# Patient Record
Sex: Female | Born: 1945 | Race: Black or African American | Hispanic: No
Health system: Southern US, Community
[De-identification: ages and names within clinical notes are randomized; demographics above are authoritative.]

---

## 2004-02-24 ENCOUNTER — Encounter: Payer: Self-pay | Admitting: Rheumatology

## 2004-07-15 ENCOUNTER — Emergency Department: Payer: Self-pay | Admitting: Emergency Medicine

## 2004-08-01 ENCOUNTER — Ambulatory Visit: Payer: Self-pay | Admitting: Internal Medicine

## 2004-09-11 ENCOUNTER — Ambulatory Visit: Payer: Self-pay

## 2004-09-20 ENCOUNTER — Ambulatory Visit: Payer: Self-pay

## 2004-11-22 ENCOUNTER — Ambulatory Visit: Payer: Self-pay | Admitting: Orthopedic Surgery

## 2004-11-28 ENCOUNTER — Ambulatory Visit: Payer: Self-pay | Admitting: Orthopedic Surgery

## 2005-03-25 ENCOUNTER — Ambulatory Visit: Payer: Self-pay | Admitting: Internal Medicine

## 2005-07-11 ENCOUNTER — Ambulatory Visit: Payer: Self-pay | Admitting: Internal Medicine

## 2006-03-21 ENCOUNTER — Ambulatory Visit: Payer: Self-pay | Admitting: Rheumatology

## 2006-04-09 ENCOUNTER — Ambulatory Visit: Payer: Self-pay | Admitting: Rheumatology

## 2006-07-10 ENCOUNTER — Ambulatory Visit: Payer: Self-pay | Admitting: Internal Medicine

## 2006-08-27 ENCOUNTER — Ambulatory Visit: Payer: Self-pay | Admitting: Internal Medicine

## 2006-09-10 ENCOUNTER — Ambulatory Visit: Payer: Self-pay | Admitting: Internal Medicine

## 2006-09-16 ENCOUNTER — Ambulatory Visit: Payer: Self-pay | Admitting: Gastroenterology

## 2006-10-01 ENCOUNTER — Ambulatory Visit: Payer: Self-pay | Admitting: Gastroenterology

## 2006-10-21 ENCOUNTER — Ambulatory Visit: Payer: Self-pay | Admitting: Internal Medicine

## 2006-11-03 ENCOUNTER — Ambulatory Visit: Payer: Self-pay | Admitting: Internal Medicine

## 2006-11-06 ENCOUNTER — Ambulatory Visit: Payer: Self-pay | Admitting: Gastroenterology

## 2006-11-07 ENCOUNTER — Ambulatory Visit: Payer: Self-pay | Admitting: Surgery

## 2006-11-07 ENCOUNTER — Other Ambulatory Visit: Payer: Self-pay

## 2006-11-12 ENCOUNTER — Ambulatory Visit: Payer: Self-pay | Admitting: Surgery

## 2006-11-25 ENCOUNTER — Ambulatory Visit: Payer: Self-pay | Admitting: Internal Medicine

## 2006-12-25 ENCOUNTER — Ambulatory Visit: Payer: Self-pay | Admitting: Internal Medicine

## 2007-02-24 ENCOUNTER — Ambulatory Visit: Payer: Self-pay | Admitting: Internal Medicine

## 2007-03-05 ENCOUNTER — Ambulatory Visit: Payer: Self-pay | Admitting: Internal Medicine

## 2007-03-27 ENCOUNTER — Ambulatory Visit: Payer: Self-pay | Admitting: Internal Medicine

## 2007-04-07 ENCOUNTER — Ambulatory Visit: Payer: Self-pay | Admitting: Surgery

## 2007-04-08 ENCOUNTER — Ambulatory Visit: Payer: Self-pay | Admitting: Surgery

## 2007-04-16 ENCOUNTER — Ambulatory Visit: Payer: Self-pay | Admitting: Internal Medicine

## 2007-04-27 ENCOUNTER — Ambulatory Visit: Payer: Self-pay | Admitting: Internal Medicine

## 2007-05-27 ENCOUNTER — Ambulatory Visit: Payer: Self-pay | Admitting: Internal Medicine

## 2007-06-27 ENCOUNTER — Ambulatory Visit: Payer: Self-pay | Admitting: Internal Medicine

## 2007-07-28 ENCOUNTER — Ambulatory Visit: Payer: Self-pay | Admitting: Internal Medicine

## 2007-08-25 ENCOUNTER — Ambulatory Visit: Payer: Self-pay | Admitting: Internal Medicine

## 2007-09-09 ENCOUNTER — Ambulatory Visit: Payer: Self-pay | Admitting: Internal Medicine

## 2007-09-14 ENCOUNTER — Ambulatory Visit: Payer: Self-pay | Admitting: Surgery

## 2007-09-17 ENCOUNTER — Ambulatory Visit: Payer: Self-pay | Admitting: Internal Medicine

## 2007-09-27 ENCOUNTER — Ambulatory Visit: Payer: Self-pay | Admitting: Internal Medicine

## 2007-10-25 ENCOUNTER — Ambulatory Visit: Payer: Self-pay | Admitting: Internal Medicine

## 2007-11-10 ENCOUNTER — Ambulatory Visit: Payer: Self-pay | Admitting: Surgery

## 2007-11-23 ENCOUNTER — Ambulatory Visit: Payer: Self-pay | Admitting: Internal Medicine

## 2007-11-25 ENCOUNTER — Ambulatory Visit: Payer: Self-pay | Admitting: Internal Medicine

## 2007-12-25 ENCOUNTER — Ambulatory Visit: Payer: Self-pay | Admitting: Internal Medicine

## 2008-01-25 ENCOUNTER — Ambulatory Visit: Payer: Self-pay | Admitting: Internal Medicine

## 2008-02-07 ENCOUNTER — Other Ambulatory Visit: Payer: Self-pay

## 2008-02-07 ENCOUNTER — Emergency Department: Payer: Self-pay | Admitting: Emergency Medicine

## 2008-02-15 ENCOUNTER — Other Ambulatory Visit: Payer: Self-pay

## 2008-02-15 ENCOUNTER — Inpatient Hospital Stay: Payer: Self-pay | Admitting: Internal Medicine

## 2008-02-24 ENCOUNTER — Ambulatory Visit: Payer: Self-pay | Admitting: Internal Medicine

## 2008-03-15 ENCOUNTER — Ambulatory Visit: Payer: Self-pay | Admitting: Internal Medicine

## 2008-03-26 ENCOUNTER — Ambulatory Visit: Payer: Self-pay | Admitting: Internal Medicine

## 2008-04-15 ENCOUNTER — Ambulatory Visit: Payer: Self-pay | Admitting: Internal Medicine

## 2008-04-17 ENCOUNTER — Emergency Department: Payer: Self-pay | Admitting: Internal Medicine

## 2008-04-17 ENCOUNTER — Other Ambulatory Visit: Payer: Self-pay

## 2008-04-26 ENCOUNTER — Ambulatory Visit: Payer: Self-pay | Admitting: Internal Medicine

## 2008-05-26 ENCOUNTER — Ambulatory Visit: Payer: Self-pay | Admitting: Internal Medicine

## 2008-07-05 ENCOUNTER — Emergency Department: Payer: Self-pay | Admitting: Unknown Physician Specialty

## 2008-07-19 ENCOUNTER — Ambulatory Visit: Payer: Self-pay | Admitting: Internal Medicine

## 2008-07-26 ENCOUNTER — Ambulatory Visit: Payer: Self-pay | Admitting: Internal Medicine

## 2008-08-16 ENCOUNTER — Ambulatory Visit: Payer: Self-pay | Admitting: Neurology

## 2008-08-26 ENCOUNTER — Ambulatory Visit: Payer: Self-pay | Admitting: Internal Medicine

## 2008-09-01 ENCOUNTER — Ambulatory Visit: Payer: Self-pay | Admitting: Internal Medicine

## 2008-09-12 ENCOUNTER — Emergency Department: Payer: Self-pay | Admitting: Emergency Medicine

## 2008-09-16 ENCOUNTER — Ambulatory Visit: Payer: Self-pay | Admitting: Surgery

## 2008-09-22 ENCOUNTER — Ambulatory Visit: Payer: Self-pay | Admitting: Rheumatology

## 2008-09-26 ENCOUNTER — Ambulatory Visit: Payer: Self-pay | Admitting: Internal Medicine

## 2008-10-24 ENCOUNTER — Ambulatory Visit: Payer: Self-pay | Admitting: Internal Medicine

## 2008-11-24 ENCOUNTER — Ambulatory Visit: Payer: Self-pay | Admitting: Internal Medicine

## 2008-11-28 ENCOUNTER — Ambulatory Visit: Payer: Self-pay | Admitting: Unknown Physician Specialty

## 2009-03-19 ENCOUNTER — Emergency Department: Payer: Self-pay | Admitting: Emergency Medicine

## 2009-04-21 ENCOUNTER — Ambulatory Visit: Payer: Self-pay | Admitting: Gastroenterology

## 2009-04-28 ENCOUNTER — Emergency Department: Payer: Self-pay | Admitting: Emergency Medicine

## 2009-05-17 ENCOUNTER — Inpatient Hospital Stay: Payer: Self-pay | Admitting: Internal Medicine

## 2009-08-15 ENCOUNTER — Ambulatory Visit: Payer: Self-pay

## 2009-09-14 ENCOUNTER — Emergency Department: Payer: Self-pay | Admitting: Emergency Medicine

## 2009-09-26 ENCOUNTER — Emergency Department: Payer: Self-pay | Admitting: Emergency Medicine

## 2009-10-02 ENCOUNTER — Ambulatory Visit: Payer: Self-pay | Admitting: Internal Medicine

## 2009-11-08 ENCOUNTER — Emergency Department: Payer: Self-pay | Admitting: Emergency Medicine

## 2009-11-27 ENCOUNTER — Ambulatory Visit: Payer: Self-pay | Admitting: Gastroenterology

## 2009-12-11 ENCOUNTER — Emergency Department: Payer: Self-pay

## 2010-01-10 ENCOUNTER — Ambulatory Visit: Payer: Self-pay | Admitting: Neurology

## 2010-01-31 ENCOUNTER — Emergency Department: Payer: Self-pay | Admitting: Emergency Medicine

## 2010-06-01 ENCOUNTER — Emergency Department: Payer: Self-pay | Admitting: Unknown Physician Specialty

## 2010-08-10 ENCOUNTER — Emergency Department (HOSPITAL_COMMUNITY)
Admission: EM | Admit: 2010-08-10 | Discharge: 2010-08-10 | Payer: Self-pay | Source: Home / Self Care | Admitting: Emergency Medicine

## 2010-10-04 ENCOUNTER — Ambulatory Visit: Payer: Self-pay | Admitting: Internal Medicine

## 2010-10-20 ENCOUNTER — Emergency Department: Payer: Self-pay | Admitting: Internal Medicine

## 2010-11-05 LAB — URINALYSIS, ROUTINE W REFLEX MICROSCOPIC
Bilirubin Urine: NEGATIVE
Glucose, UA: NEGATIVE mg/dL
Hgb urine dipstick: NEGATIVE
Specific Gravity, Urine: 1.012 (ref 1.005–1.030)
pH: 7.5 (ref 5.0–8.0)

## 2010-11-05 LAB — CBC
Hemoglobin: 11.2 g/dL — ABNORMAL LOW (ref 12.0–15.0)
MCV: 84.8 fL (ref 78.0–100.0)
WBC: 11.4 10*3/uL — ABNORMAL HIGH (ref 4.0–10.5)

## 2010-11-05 LAB — BASIC METABOLIC PANEL
Calcium: 8.7 mg/dL (ref 8.4–10.5)
GFR calc Af Amer: >60 mL/min (ref >60)
GFR calc non Af Amer: 50 mL/min — ABNORMAL LOW (ref >60)
Sodium: 142 mEq/L (ref 135–145)

## 2010-11-05 LAB — DIFFERENTIAL
Basophils Relative: 0 % (ref 0–1)
Eosinophils Absolute: 0.1 10*3/uL (ref 0.0–0.7)
Eosinophils Relative: 1 % (ref 0–5)
Lymphocytes Relative: 12 % (ref 12–46)
Monocytes Relative: 13 % — ABNORMAL HIGH (ref 3–12)
Neutro Abs: 8.5 10*3/uL — ABNORMAL HIGH (ref 1.7–7.7)

## 2010-11-05 LAB — URINE CULTURE
Colony Count: 6000
Culture  Setup Time: 201112161145

## 2010-11-19 ENCOUNTER — Emergency Department: Payer: Self-pay | Admitting: Emergency Medicine

## 2011-04-12 ENCOUNTER — Ambulatory Visit: Payer: Self-pay | Admitting: Internal Medicine

## 2011-05-06 ENCOUNTER — Ambulatory Visit: Payer: Self-pay | Admitting: Internal Medicine

## 2011-05-23 ENCOUNTER — Other Ambulatory Visit: Payer: Self-pay | Admitting: Gastroenterology

## 2011-05-24 ENCOUNTER — Other Ambulatory Visit: Payer: Self-pay | Admitting: Gastroenterology

## 2011-05-29 ENCOUNTER — Emergency Department: Payer: Self-pay

## 2011-08-08 ENCOUNTER — Ambulatory Visit: Payer: Self-pay | Admitting: Rheumatology

## 2011-09-17 ENCOUNTER — Ambulatory Visit: Payer: Self-pay | Admitting: Specialist

## 2011-10-10 ENCOUNTER — Ambulatory Visit: Payer: Self-pay | Admitting: Specialist

## 2012-01-23 ENCOUNTER — Inpatient Hospital Stay: Payer: Self-pay | Admitting: Internal Medicine

## 2012-01-23 LAB — COMPREHENSIVE METABOLIC PANEL
Anion Gap: 11 (ref 7–16)
BUN: 19 mg/dL — ABNORMAL HIGH (ref 7–18)
Bilirubin,Total: 0.3 mg/dL (ref 0.2–1.0)
Chloride: 106 mmol/L (ref 98–107)
Co2: 27 mmol/L (ref 21–32)
Glucose: 80 mg/dL (ref 65–99)
Osmolality: 288 (ref 275–301)
Potassium: 3.2 mmol/L — ABNORMAL LOW (ref 3.5–5.1)
SGPT (ALT): 12 U/L
Sodium: 144 mmol/L (ref 136–145)

## 2012-01-23 LAB — CBC
HCT: 36.4 % (ref 35.0–47.0)
HGB: 11.7 g/dL — ABNORMAL LOW (ref 12.0–16.0)
MCH: 26.7 pg (ref 26.0–34.0)
MCHC: 32.2 g/dL (ref 32.0–36.0)
MCV: 83 fL (ref 80–100)
Platelet: 269 10*3/uL (ref 150–440)
WBC: 6.8 10*3/uL (ref 3.6–11.0)

## 2012-01-23 LAB — URINALYSIS, COMPLETE
Blood: NEGATIVE
Glucose,UR: NEGATIVE mg/dL (ref 0–75)
Ph: 6 (ref 4.5–8.0)
Protein: 30
Squamous Epithelial: <1

## 2012-01-23 LAB — LIPASE, BLOOD: Lipase: 45 U/L — ABNORMAL LOW (ref 73–393)

## 2012-01-23 LAB — TROPONIN I: Troponin-I: <0.02 ng/mL

## 2012-01-24 LAB — BASIC METABOLIC PANEL
Anion Gap: 10 (ref 7–16)
BUN: 16 mg/dL (ref 7–18)
Calcium, Total: 8.2 mg/dL — ABNORMAL LOW (ref 8.5–10.1)
Co2: 26 mmol/L (ref 21–32)
Glucose: 80 mg/dL (ref 65–99)
Sodium: 145 mmol/L (ref 136–145)

## 2012-01-24 LAB — CBC WITH DIFFERENTIAL/PLATELET
Basophil #: 0 10*3/uL (ref 0.0–0.1)
Eosinophil #: 0 10*3/uL (ref 0.0–0.7)
MCV: 82 fL (ref 80–100)
Monocyte #: 1.9 x10 3/mm — ABNORMAL HIGH (ref 0.2–0.9)
Platelet: 256 10*3/uL (ref 150–440)
RDW: 16.3 % — ABNORMAL HIGH (ref 11.5–14.5)

## 2012-01-24 LAB — LIPID PANEL
HDL Cholesterol: 91 mg/dL — ABNORMAL HIGH (ref 40–60)
Triglycerides: 41 mg/dL (ref 0–200)
VLDL Cholesterol, Calc: 8 mg/dL (ref 5–40)

## 2012-01-25 LAB — MAGNESIUM: Magnesium: 2 mg/dL

## 2012-01-25 LAB — CBC WITH DIFFERENTIAL/PLATELET
Basophil %: 0.2 %
Eosinophil #: 0 10*3/uL (ref 0.0–0.7)
HCT: 32.3 % — ABNORMAL LOW (ref 35.0–47.0)
HGB: 10.3 g/dL — ABNORMAL LOW (ref 12.0–16.0)
Lymphocyte #: 1.8 10*3/uL (ref 1.0–3.6)
Lymphocyte %: 7.2 %
MCH: 26.5 pg (ref 26.0–34.0)
MCHC: 31.9 g/dL — ABNORMAL LOW (ref 32.0–36.0)
Neutrophil #: 21.3 10*3/uL — ABNORMAL HIGH (ref 1.4–6.5)
Neutrophil %: 85.1 %
RBC: 3.89 10*6/uL (ref 3.80–5.20)

## 2012-01-25 LAB — BASIC METABOLIC PANEL
BUN: 14 mg/dL (ref 7–18)
Chloride: 107 mmol/L (ref 98–107)
Glucose: 78 mg/dL (ref 65–99)
Osmolality: 286 (ref 275–301)
Potassium: 3.5 mmol/L (ref 3.5–5.1)

## 2012-01-26 LAB — CBC WITH DIFFERENTIAL/PLATELET
Basophil #: 0 10*3/uL (ref 0.0–0.1)
Eosinophil #: 0 10*3/uL (ref 0.0–0.7)
Eosinophil %: 0 %
Lymphocyte #: 1 10*3/uL (ref 1.0–3.6)
MCHC: 31.7 g/dL — ABNORMAL LOW (ref 32.0–36.0)
MCV: 83 fL (ref 80–100)
Monocyte #: 1.1 x10 3/mm — ABNORMAL HIGH (ref 0.2–0.9)
Monocyte %: 4.4 %
Neutrophil %: 91.6 %
Platelet: 224 10*3/uL (ref 150–440)
RBC: 3.89 10*6/uL (ref 3.80–5.20)
RDW: 16.7 % — ABNORMAL HIGH (ref 11.5–14.5)
WBC: 25.3 10*3/uL — ABNORMAL HIGH (ref 3.6–11.0)

## 2012-01-26 LAB — BASIC METABOLIC PANEL
Anion Gap: 9 (ref 7–16)
BUN: 26 mg/dL — ABNORMAL HIGH (ref 7–18)
Chloride: 103 mmol/L (ref 98–107)
Co2: 25 mmol/L (ref 21–32)
Creatinine: 1.6 mg/dL — ABNORMAL HIGH (ref 0.60–1.30)
EGFR (African American): 38 — ABNORMAL LOW
Osmolality: 282 (ref 275–301)

## 2012-01-27 LAB — CBC WITH DIFFERENTIAL/PLATELET
Basophil #: 0.1 10*3/uL (ref 0.0–0.1)
Eosinophil #: 0 10*3/uL (ref 0.0–0.7)
Eosinophil %: 0 %
MCH: 25.5 pg — ABNORMAL LOW (ref 26.0–34.0)
MCHC: 31.1 g/dL — ABNORMAL LOW (ref 32.0–36.0)
MCV: 82 fL (ref 80–100)
Neutrophil %: 81.9 %

## 2012-01-27 LAB — BASIC METABOLIC PANEL
Calcium, Total: 8.6 mg/dL (ref 8.5–10.1)
Chloride: 107 mmol/L (ref 98–107)
Creatinine: 1.41 mg/dL — ABNORMAL HIGH (ref 0.60–1.30)
EGFR (African American): 44 — ABNORMAL LOW
EGFR (Non-African Amer.): 38 — ABNORMAL LOW
Sodium: 142 mmol/L (ref 136–145)

## 2012-01-28 LAB — CBC WITH DIFFERENTIAL/PLATELET
Basophil %: 0.4 %
Eosinophil #: 0 10*3/uL (ref 0.0–0.7)
HCT: 31.1 % — ABNORMAL LOW (ref 35.0–47.0)
HGB: 9.7 g/dL — ABNORMAL LOW (ref 12.0–16.0)
Lymphocyte %: 17.1 %
MCHC: 31.2 g/dL — ABNORMAL LOW (ref 32.0–36.0)
MCV: 82 fL (ref 80–100)
Monocyte #: 2.1 x10 3/mm — ABNORMAL HIGH (ref 0.2–0.9)
Neutrophil #: 9.9 10*3/uL — ABNORMAL HIGH (ref 1.4–6.5)
Neutrophil %: 67.7 %
RBC: 3.78 10*6/uL — ABNORMAL LOW (ref 3.80–5.20)

## 2012-01-29 LAB — CBC WITH DIFFERENTIAL/PLATELET
Basophil %: 0.2 %
Eosinophil #: 0.1 10*3/uL (ref 0.0–0.7)
HCT: 30.4 % — ABNORMAL LOW (ref 35.0–47.0)
HGB: 9.7 g/dL — ABNORMAL LOW (ref 12.0–16.0)
Lymphocyte #: 2.1 10*3/uL (ref 1.0–3.6)
Lymphocyte %: 11.6 %
MCHC: 31.9 g/dL — ABNORMAL LOW (ref 32.0–36.0)
MCV: 81 fL (ref 80–100)
Monocyte %: 7.6 %
Platelet: 262 10*3/uL (ref 150–440)
RDW: 17.1 % — ABNORMAL HIGH (ref 11.5–14.5)
WBC: 17.7 10*3/uL — ABNORMAL HIGH (ref 3.6–11.0)

## 2012-01-29 LAB — CULTURE, BLOOD (SINGLE)

## 2012-01-29 LAB — CREATININE, SERUM
Creatinine: 1.16 mg/dL (ref 0.60–1.30)
EGFR (African American): 56 — ABNORMAL LOW

## 2012-01-30 LAB — BASIC METABOLIC PANEL
Chloride: 106 mmol/L (ref 98–107)
Co2: 27 mmol/L (ref 21–32)
Creatinine: 1.09 mg/dL (ref 0.60–1.30)
EGFR (Non-African Amer.): 52 — ABNORMAL LOW
Sodium: 142 mmol/L (ref 136–145)

## 2012-01-30 LAB — CBC WITH DIFFERENTIAL/PLATELET
Eosinophil #: 0 10*3/uL (ref 0.0–0.7)
Eosinophil %: 0 %
HCT: 31.6 % — ABNORMAL LOW (ref 35.0–47.0)
HGB: 9.9 g/dL — ABNORMAL LOW (ref 12.0–16.0)
Lymphocyte %: 4 %
MCHC: 31.2 g/dL — ABNORMAL LOW (ref 32.0–36.0)
Monocyte %: 2.8 %
Neutrophil #: 17 10*3/uL — ABNORMAL HIGH (ref 1.4–6.5)
Neutrophil %: 93.1 %
RBC: 3.88 10*6/uL (ref 3.80–5.20)

## 2012-01-31 LAB — CBC WITH DIFFERENTIAL/PLATELET
Basophil #: 0.1 10*3/uL (ref 0.0–0.1)
Basophil %: 0.3 %
Eosinophil #: 0 10*3/uL (ref 0.0–0.7)
Eosinophil %: 0.1 %
Lymphocyte #: 0.6 10*3/uL — ABNORMAL LOW (ref 1.0–3.6)
MCH: 25.4 pg — ABNORMAL LOW (ref 26.0–34.0)
MCHC: 31.1 g/dL — ABNORMAL LOW (ref 32.0–36.0)
MCV: 82 fL (ref 80–100)
Monocyte #: 1.1 x10 3/mm — ABNORMAL HIGH (ref 0.2–0.9)
Monocyte %: 4.7 %
Neutrophil #: 21 10*3/uL — ABNORMAL HIGH (ref 1.4–6.5)
RDW: 16.8 % — ABNORMAL HIGH (ref 11.5–14.5)

## 2012-02-01 LAB — BASIC METABOLIC PANEL
BUN: 41 mg/dL — ABNORMAL HIGH (ref 7–18)
Calcium, Total: 8.7 mg/dL (ref 8.5–10.1)
Chloride: 103 mmol/L (ref 98–107)
Creatinine: 1.34 mg/dL — ABNORMAL HIGH (ref 0.60–1.30)
Potassium: 4.7 mmol/L (ref 3.5–5.1)
Sodium: 140 mmol/L (ref 136–145)

## 2012-02-01 LAB — CBC WITH DIFFERENTIAL/PLATELET
Eosinophil %: 0 %
HCT: 30.2 % — ABNORMAL LOW (ref 35.0–47.0)
Lymphocyte %: 4.8 %
MCH: 26 pg (ref 26.0–34.0)
MCV: 81 fL (ref 80–100)
Monocyte %: 6 %
Neutrophil #: 16.8 10*3/uL — ABNORMAL HIGH (ref 1.4–6.5)
WBC: 18.9 10*3/uL — ABNORMAL HIGH (ref 3.6–11.0)

## 2012-02-01 LAB — URINALYSIS, COMPLETE
Bilirubin,UR: NEGATIVE
Blood: NEGATIVE
Hyaline Cast: 3
Protein: NEGATIVE

## 2012-02-02 LAB — CBC WITH DIFFERENTIAL/PLATELET
Basophil #: 0 10*3/uL (ref 0.0–0.1)
Basophil %: 0.1 %
Eosinophil #: 0 10*3/uL (ref 0.0–0.7)
Lymphocyte #: 2.1 10*3/uL (ref 1.0–3.6)
Lymphocyte %: 13.5 %
MCHC: 31.8 g/dL — ABNORMAL LOW (ref 32.0–36.0)

## 2012-02-02 LAB — BASIC METABOLIC PANEL
BUN: 44 mg/dL — ABNORMAL HIGH (ref 7–18)
Co2: 29 mmol/L (ref 21–32)
EGFR (Non-African Amer.): 43 — ABNORMAL LOW
Glucose: 91 mg/dL (ref 65–99)
Potassium: 4.2 mmol/L (ref 3.5–5.1)
Sodium: 143 mmol/L (ref 136–145)

## 2012-02-03 LAB — CBC WITH DIFFERENTIAL/PLATELET
Basophil #: 0 10*3/uL (ref 0.0–0.1)
Basophil %: 0.2 %
Eosinophil %: 0.9 %
Lymphocyte #: 2.1 10*3/uL (ref 1.0–3.6)
Lymphocyte %: 18 %
MCH: 25.5 pg — ABNORMAL LOW (ref 26.0–34.0)
MCV: 82 fL (ref 80–100)
Monocyte #: 1.3 x10 3/mm — ABNORMAL HIGH (ref 0.2–0.9)
Neutrophil %: 69.6 %
RDW: 16.5 % — ABNORMAL HIGH (ref 11.5–14.5)
WBC: 11.5 10*3/uL — ABNORMAL HIGH (ref 3.6–11.0)

## 2012-02-03 LAB — URINE CULTURE

## 2012-02-04 LAB — CBC WITH DIFFERENTIAL/PLATELET
Basophil #: 0 10*3/uL (ref 0.0–0.1)
Eosinophil #: 0 10*3/uL (ref 0.0–0.7)
HGB: 9.2 g/dL — ABNORMAL LOW (ref 12.0–16.0)
Lymphocyte %: 4.5 %
MCH: 26.1 pg (ref 26.0–34.0)
MCHC: 31.7 g/dL — ABNORMAL LOW (ref 32.0–36.0)
MCV: 82 fL (ref 80–100)
Monocyte #: 0.3 x10 3/mm (ref 0.2–0.9)
Monocyte %: 1.7 %
Neutrophil #: 14.1 10*3/uL — ABNORMAL HIGH (ref 1.4–6.5)
Neutrophil %: 93.8 %
RBC: 3.54 10*6/uL — ABNORMAL LOW (ref 3.80–5.20)
RDW: 16.2 % — ABNORMAL HIGH (ref 11.5–14.5)
WBC: 15 10*3/uL — ABNORMAL HIGH (ref 3.6–11.0)

## 2012-02-04 LAB — URINALYSIS, COMPLETE
Bacteria: NONE SEEN
Bilirubin,UR: NEGATIVE
Ketone: NEGATIVE
Leukocyte Esterase: NEGATIVE
Ph: 5 (ref 4.5–8.0)
Protein: NEGATIVE
Specific Gravity: 1.018 (ref 1.003–1.030)
Squamous Epithelial: 2
Transitional Epi: <1
WBC UR: 5 /HPF (ref 0–5)

## 2012-02-04 LAB — CULTURE, BLOOD (SINGLE)

## 2012-02-05 ENCOUNTER — Other Ambulatory Visit: Payer: Self-pay | Admitting: Geriatric Medicine

## 2012-02-05 LAB — WBC: WBC: 25 10*3/uL — ABNORMAL HIGH (ref 3.6–11.0)

## 2012-02-06 ENCOUNTER — Other Ambulatory Visit: Payer: Self-pay

## 2012-02-06 LAB — CBC WITH DIFFERENTIAL/PLATELET
Basophil #: 0 10*3/uL (ref 0.0–0.1)
Eosinophil #: 0 10*3/uL (ref 0.0–0.7)
Eosinophil %: 0 %
HCT: 31.5 % — ABNORMAL LOW (ref 35.0–47.0)
HGB: 10 g/dL — ABNORMAL LOW (ref 12.0–16.0)
Lymphocyte %: 4.6 %
MCH: 26 pg (ref 26.0–34.0)
MCHC: 31.8 g/dL — ABNORMAL LOW (ref 32.0–36.0)
MCV: 82 fL (ref 80–100)
Monocyte #: 0.9 x10 3/mm (ref 0.2–0.9)
Neutrophil #: 13.2 10*3/uL — ABNORMAL HIGH (ref 1.4–6.5)
Neutrophil %: 89.3 %
Platelet: 462 10*3/uL — ABNORMAL HIGH (ref 150–440)
RDW: 16.6 % — ABNORMAL HIGH (ref 11.5–14.5)

## 2012-02-27 ENCOUNTER — Emergency Department: Payer: Self-pay | Admitting: Emergency Medicine

## 2012-02-27 LAB — COMPREHENSIVE METABOLIC PANEL
Albumin: 3.1 g/dL — ABNORMAL LOW (ref 3.4–5.0)
Anion Gap: 9 (ref 7–16)
BUN: 14 mg/dL (ref 7–18)
Bilirubin,Total: 0.3 mg/dL (ref 0.2–1.0)
Creatinine: 1.54 mg/dL — ABNORMAL HIGH (ref 0.60–1.30)
Glucose: 130 mg/dL — ABNORMAL HIGH (ref 65–99)
Osmolality: 283 (ref 275–301)
Potassium: 4.8 mmol/L (ref 3.5–5.1)
SGOT(AST): 15 U/L (ref 15–37)
Sodium: 141 mmol/L (ref 136–145)
Total Protein: 6.5 g/dL (ref 6.4–8.2)

## 2012-02-27 LAB — CBC
HCT: 32.7 % — ABNORMAL LOW (ref 35.0–47.0)
MCH: 26.8 pg (ref 26.0–34.0)
MCHC: 32 g/dL (ref 32.0–36.0)
RDW: 18.4 % — ABNORMAL HIGH (ref 11.5–14.5)

## 2012-02-27 LAB — URINALYSIS, COMPLETE
Blood: NEGATIVE
Ketone: NEGATIVE
Ph: 6 (ref 4.5–8.0)
Protein: NEGATIVE
Specific Gravity: 1.011 (ref 1.003–1.030)

## 2012-03-11 ENCOUNTER — Ambulatory Visit: Payer: Self-pay | Admitting: Specialist

## 2012-06-03 ENCOUNTER — Ambulatory Visit: Payer: Self-pay | Admitting: Gastroenterology

## 2012-10-12 ENCOUNTER — Ambulatory Visit: Payer: Self-pay

## 2012-10-13 ENCOUNTER — Ambulatory Visit: Payer: Self-pay | Admitting: Specialist

## 2012-12-09 ENCOUNTER — Inpatient Hospital Stay: Payer: Self-pay | Admitting: Internal Medicine

## 2012-12-09 LAB — POTASSIUM: Potassium: 4.3 mmol/L (ref 3.5–5.1)

## 2012-12-09 LAB — URINALYSIS, COMPLETE
Glucose,UR: NEGATIVE mg/dL (ref 0–75)
Ph: 6 (ref 4.5–8.0)
Protein: NEGATIVE
Specific Gravity: 1.011 (ref 1.003–1.030)

## 2012-12-09 LAB — MAGNESIUM: Magnesium: 1.4 mg/dL — ABNORMAL LOW

## 2012-12-09 LAB — COMPREHENSIVE METABOLIC PANEL
Alkaline Phosphatase: 98 U/L (ref 50–136)
Anion Gap: 8 (ref 7–16)
BUN: 19 mg/dL — ABNORMAL HIGH (ref 7–18)
Calcium, Total: 7.7 mg/dL — ABNORMAL LOW (ref 8.5–10.1)
Chloride: 110 mmol/L — ABNORMAL HIGH (ref 98–107)
Co2: 26 mmol/L (ref 21–32)
Creatinine: 1.11 mg/dL (ref 0.60–1.30)
EGFR (African American): 59 — ABNORMAL LOW
EGFR (Non-African Amer.): 51 — ABNORMAL LOW
Osmolality: 287 (ref 275–301)

## 2012-12-09 LAB — CBC
HGB: 10.9 g/dL — ABNORMAL LOW (ref 12.0–16.0)
MCV: 80 fL (ref 80–100)
Platelet: 278 10*3/uL (ref 150–440)
WBC: 14.6 10*3/uL — ABNORMAL HIGH (ref 3.6–11.0)

## 2012-12-09 LAB — PRO B NATRIURETIC PEPTIDE: B-Type Natriuretic Peptide: 820 pg/mL — ABNORMAL HIGH (ref 0–125)

## 2012-12-10 LAB — BASIC METABOLIC PANEL
Anion Gap: 6 — ABNORMAL LOW (ref 7–16)
Chloride: 112 mmol/L — ABNORMAL HIGH (ref 98–107)
Co2: 27 mmol/L (ref 21–32)
EGFR (Non-African Amer.): 54 — ABNORMAL LOW
Sodium: 145 mmol/L (ref 136–145)

## 2012-12-10 LAB — CBC WITH DIFFERENTIAL/PLATELET
Basophil #: 0.1 10*3/uL (ref 0.0–0.1)
Eosinophil #: 0 10*3/uL (ref 0.0–0.7)
HGB: 9.9 g/dL — ABNORMAL LOW (ref 12.0–16.0)
Lymphocyte %: 8.3 %
MCH: 25.4 pg — ABNORMAL LOW (ref 26.0–34.0)
MCHC: 31.8 g/dL — ABNORMAL LOW (ref 32.0–36.0)
Monocyte %: 6 %
Neutrophil %: 85.3 %
Platelet: 241 10*3/uL (ref 150–440)
RBC: 3.89 10*6/uL (ref 3.80–5.20)
RDW: 18.3 % — ABNORMAL HIGH (ref 11.5–14.5)

## 2012-12-10 LAB — URINALYSIS, COMPLETE
Bilirubin,UR: NEGATIVE
Blood: NEGATIVE
Ketone: NEGATIVE
Nitrite: NEGATIVE
Ph: 7 (ref 4.5–8.0)
Protein: NEGATIVE
RBC,UR: 1 /HPF (ref 0–5)
Specific Gravity: 1.006 (ref 1.003–1.030)
WBC UR: <1 /HPF (ref 0–5)

## 2012-12-10 LAB — MAGNESIUM: Magnesium: 1.6 mg/dL — ABNORMAL LOW

## 2012-12-10 LAB — URINE CULTURE

## 2012-12-11 LAB — CBC WITH DIFFERENTIAL/PLATELET
Basophil #: 0.1 10*3/uL (ref 0.0–0.1)
Basophil %: 0.5 %
HGB: 10.5 g/dL — ABNORMAL LOW (ref 12.0–16.0)
Lymphocyte #: 2 10*3/uL (ref 1.0–3.6)
Lymphocyte %: 8.6 %
MCH: 25.7 pg — ABNORMAL LOW (ref 26.0–34.0)
MCHC: 32.5 g/dL (ref 32.0–36.0)
MCV: 79 fL — ABNORMAL LOW (ref 80–100)
Monocyte #: 1.4 x10 3/mm — ABNORMAL HIGH (ref 0.2–0.9)
Neutrophil %: 84.6 %
Platelet: 211 10*3/uL (ref 150–440)
RBC: 4.09 10*6/uL (ref 3.80–5.20)
RDW: 18.4 % — ABNORMAL HIGH (ref 11.5–14.5)
WBC: 23.1 10*3/uL — ABNORMAL HIGH (ref 3.6–11.0)

## 2012-12-12 DIAGNOSIS — I359 Nonrheumatic aortic valve disorder, unspecified: Secondary | ICD-10-CM

## 2012-12-12 LAB — CBC WITH DIFFERENTIAL/PLATELET
Basophil #: 0.1 10*3/uL (ref 0.0–0.1)
Basophil %: 0.4 %
Eosinophil %: 0 %
HCT: 32.2 % — ABNORMAL LOW (ref 35.0–47.0)
HGB: 10.4 g/dL — ABNORMAL LOW (ref 12.0–16.0)
Lymphocyte #: 0.7 10*3/uL — ABNORMAL LOW (ref 1.0–3.6)
Lymphocyte %: 3.9 %
MCHC: 32.4 g/dL (ref 32.0–36.0)
Monocyte #: 0.4 x10 3/mm (ref 0.2–0.9)
Monocyte %: 2.1 %
Neutrophil %: 93.6 %
RDW: 18 % — ABNORMAL HIGH (ref 11.5–14.5)

## 2012-12-12 LAB — URINE CULTURE

## 2012-12-12 LAB — MAGNESIUM: Magnesium: 1.5 mg/dL — ABNORMAL LOW

## 2012-12-13 LAB — BASIC METABOLIC PANEL
BUN: 55 mg/dL — ABNORMAL HIGH (ref 7–18)
Chloride: 97 mmol/L — ABNORMAL LOW (ref 98–107)
Co2: 25 mmol/L (ref 21–32)
EGFR (Non-African Amer.): 24 — ABNORMAL LOW
Potassium: 3.8 mmol/L (ref 3.5–5.1)

## 2012-12-13 LAB — MAGNESIUM: Magnesium: 2.2 mg/dL

## 2012-12-14 LAB — BASIC METABOLIC PANEL
BUN: 76 mg/dL — ABNORMAL HIGH (ref 7–18)
Calcium, Total: 7.9 mg/dL — ABNORMAL LOW (ref 8.5–10.1)
Chloride: 95 mmol/L — ABNORMAL LOW (ref 98–107)
Co2: 25 mmol/L (ref 21–32)
EGFR (Non-African Amer.): 20 — ABNORMAL LOW
Osmolality: 286 (ref 275–301)
Potassium: 4 mmol/L (ref 3.5–5.1)

## 2012-12-14 LAB — VANCOMYCIN, TROUGH: Vancomycin, Trough: 24 ug/mL (ref 10–20)

## 2012-12-15 LAB — CBC WITH DIFFERENTIAL/PLATELET
Basophil #: 0 10*3/uL (ref 0.0–0.1)
Basophil %: 0.3 %
Eosinophil #: 0.1 10*3/uL (ref 0.0–0.7)
Eosinophil %: 1.1 %
Lymphocyte #: 0.5 10*3/uL — ABNORMAL LOW (ref 1.0–3.6)
Lymphocyte %: 4.5 %
MCH: 25.8 pg — ABNORMAL LOW (ref 26.0–34.0)
MCHC: 33.1 g/dL (ref 32.0–36.0)
MCV: 78 fL — ABNORMAL LOW (ref 80–100)
Neutrophil %: 90.4 %
RBC: 4.14 10*6/uL (ref 3.80–5.20)
RDW: 18 % — ABNORMAL HIGH (ref 11.5–14.5)
WBC: 11.4 10*3/uL — ABNORMAL HIGH (ref 3.6–11.0)

## 2012-12-15 LAB — BASIC METABOLIC PANEL
Anion Gap: 8 (ref 7–16)
Calcium, Total: 7.7 mg/dL — ABNORMAL LOW (ref 8.5–10.1)
Co2: 24 mmol/L (ref 21–32)
EGFR (African American): 29 — ABNORMAL LOW
EGFR (Non-African Amer.): 25 — ABNORMAL LOW
Glucose: 186 mg/dL — ABNORMAL HIGH (ref 65–99)
Osmolality: 297 (ref 275–301)
Sodium: 135 mmol/L — ABNORMAL LOW (ref 136–145)

## 2012-12-15 LAB — CULTURE, BLOOD (SINGLE)

## 2012-12-15 LAB — SEDIMENTATION RATE: Erythrocyte Sed Rate: 34 mm/hr — ABNORMAL HIGH (ref 0–30)

## 2012-12-29 ENCOUNTER — Emergency Department: Payer: Self-pay | Admitting: Emergency Medicine

## 2013-02-01 ENCOUNTER — Ambulatory Visit: Payer: Self-pay | Admitting: Specialist

## 2013-04-17 ENCOUNTER — Emergency Department: Payer: Self-pay | Admitting: Emergency Medicine

## 2013-04-17 LAB — URINALYSIS, COMPLETE
Bilirubin,UR: NEGATIVE
Blood: NEGATIVE
Ketone: NEGATIVE
Nitrite: NEGATIVE
WBC UR: 4 /HPF (ref 0–5)

## 2013-04-17 LAB — COMPREHENSIVE METABOLIC PANEL
Albumin: 2.7 g/dL — ABNORMAL LOW (ref 3.4–5.0)
Alkaline Phosphatase: 108 U/L (ref 50–136)
Anion Gap: 6 — ABNORMAL LOW (ref 7–16)
BUN: 30 mg/dL — ABNORMAL HIGH (ref 7–18)
Bilirubin,Total: 0.2 mg/dL (ref 0.2–1.0)
Calcium, Total: 7.7 mg/dL — ABNORMAL LOW (ref 8.5–10.1)
Chloride: 109 mmol/L — ABNORMAL HIGH (ref 98–107)
Creatinine: 1.21 mg/dL (ref 0.60–1.30)
EGFR (African American): 53 — ABNORMAL LOW
Osmolality: 289 (ref 275–301)
Potassium: 4 mmol/L (ref 3.5–5.1)
SGOT(AST): 20 U/L (ref 15–37)
SGPT (ALT): 17 U/L (ref 12–78)

## 2013-04-17 LAB — CBC
HCT: 37.5 % (ref 35.0–47.0)
HGB: 12.6 g/dL (ref 12.0–16.0)
MCHC: 33.7 g/dL (ref 32.0–36.0)
Platelet: 244 10*3/uL (ref 150–440)
RDW: 15.7 % — ABNORMAL HIGH (ref 11.5–14.5)

## 2013-04-17 LAB — TSH: Thyroid Stimulating Horm: 0.369 u[IU]/mL — ABNORMAL LOW

## 2013-04-17 LAB — PRO B NATRIURETIC PEPTIDE: B-Type Natriuretic Peptide: 690 pg/mL — ABNORMAL HIGH (ref 0–125)

## 2013-05-11 ENCOUNTER — Inpatient Hospital Stay: Payer: Self-pay | Admitting: Internal Medicine

## 2013-05-11 LAB — CBC
MCH: 28.6 pg (ref 26.0–34.0)
MCV: 85 fL (ref 80–100)

## 2013-05-11 LAB — URINALYSIS, COMPLETE
Bilirubin,UR: NEGATIVE
Blood: NEGATIVE
Nitrite: NEGATIVE
Protein: 25
Specific Gravity: 1.005 (ref 1.003–1.030)
Squamous Epithelial: 6
WBC UR: 5 /HPF (ref 0–5)

## 2013-05-11 LAB — COMPREHENSIVE METABOLIC PANEL
Alkaline Phosphatase: 142 U/L — ABNORMAL HIGH (ref 50–136)
Anion Gap: 6 — ABNORMAL LOW (ref 7–16)
BUN: 15 mg/dL (ref 7–18)
Calcium, Total: 8.8 mg/dL (ref 8.5–10.1)
Chloride: 108 mmol/L — ABNORMAL HIGH (ref 98–107)
Creatinine: 1.12 mg/dL (ref 0.60–1.30)
EGFR (Non-African Amer.): 50 — ABNORMAL LOW
Glucose: 80 mg/dL (ref 65–99)
Osmolality: 285 (ref 275–301)
Potassium: 3.4 mmol/L — ABNORMAL LOW (ref 3.5–5.1)
Sodium: 143 mmol/L (ref 136–145)
Total Protein: 6.4 g/dL (ref 6.4–8.2)

## 2013-05-11 LAB — TROPONIN I: Troponin-I: 0.02 ng/mL

## 2013-05-11 LAB — CK TOTAL AND CKMB (NOT AT ARMC)
CK, Total: 76 U/L (ref 21–215)
CK-MB: <0.5 ng/mL — ABNORMAL LOW (ref 0.5–3.6)

## 2013-05-12 LAB — CBC WITH DIFFERENTIAL/PLATELET
Basophil #: 0.1 10*3/uL (ref 0.0–0.1)
Basophil %: 0.8 %
Eosinophil %: 0.1 %
Lymphocyte #: 1.7 10*3/uL (ref 1.0–3.6)
Lymphocyte %: 11.1 %
MCH: 28.4 pg (ref 26.0–34.0)
Monocyte %: 12.6 %
Platelet: 255 10*3/uL (ref 150–440)
RDW: 15.4 % — ABNORMAL HIGH (ref 11.5–14.5)
WBC: 15.4 10*3/uL — ABNORMAL HIGH (ref 3.6–11.0)

## 2013-05-12 LAB — BASIC METABOLIC PANEL
Calcium, Total: 8.6 mg/dL (ref 8.5–10.1)
Chloride: 106 mmol/L (ref 98–107)
Co2: 27 mmol/L (ref 21–32)
EGFR (African American): 57 — ABNORMAL LOW
Osmolality: 281 (ref 275–301)
Sodium: 142 mmol/L (ref 136–145)

## 2013-05-12 LAB — URINE CULTURE

## 2013-05-13 LAB — CBC WITH DIFFERENTIAL/PLATELET
Basophil #: 0 10*3/uL (ref 0.0–0.1)
Eosinophil #: 0 10*3/uL (ref 0.0–0.7)
Eosinophil %: 0.1 %
HCT: 32.6 % — ABNORMAL LOW (ref 35.0–47.0)
HGB: 11 g/dL — ABNORMAL LOW (ref 12.0–16.0)
Lymphocyte #: 2 10*3/uL (ref 1.0–3.6)
MCHC: 33.6 g/dL (ref 32.0–36.0)
MCV: 85 fL (ref 80–100)
Neutrophil %: 68.2 %

## 2013-05-13 LAB — BASIC METABOLIC PANEL
Anion Gap: 9 (ref 7–16)
Calcium, Total: 8.4 mg/dL — ABNORMAL LOW (ref 8.5–10.1)
Chloride: 108 mmol/L — ABNORMAL HIGH (ref 98–107)
Co2: 24 mmol/L (ref 21–32)
EGFR (African American): 51 — ABNORMAL LOW
EGFR (Non-African Amer.): 44 — ABNORMAL LOW
Osmolality: 281 (ref 275–301)
Potassium: 3.2 mmol/L — ABNORMAL LOW (ref 3.5–5.1)
Sodium: 141 mmol/L (ref 136–145)

## 2013-05-14 LAB — CBC WITH DIFFERENTIAL/PLATELET
Basophil %: 0.4 %
Eosinophil %: 0 %
HCT: 34.2 % — ABNORMAL LOW (ref 35.0–47.0)
Lymphocyte %: 5.9 %
MCH: 28.4 pg (ref 26.0–34.0)
MCHC: 33.6 g/dL (ref 32.0–36.0)
Monocyte %: 2.2 %
Neutrophil #: 11 10*3/uL — ABNORMAL HIGH (ref 1.4–6.5)
Neutrophil %: 91.5 %
Platelet: 319 10*3/uL (ref 150–440)
RBC: 4.05 10*6/uL (ref 3.80–5.20)
RDW: 15.2 % — ABNORMAL HIGH (ref 11.5–14.5)
WBC: 12 10*3/uL — ABNORMAL HIGH (ref 3.6–11.0)

## 2013-05-14 LAB — BASIC METABOLIC PANEL
BUN: 20 mg/dL — ABNORMAL HIGH (ref 7–18)
Osmolality: 284 (ref 275–301)
Potassium: 4.4 mmol/L (ref 3.5–5.1)
Sodium: 139 mmol/L (ref 136–145)

## 2013-05-14 LAB — URINALYSIS, COMPLETE
Bilirubin,UR: NEGATIVE
Granular Cast: 5
Hyaline Cast: 3
Ketone: NEGATIVE
Nitrite: NEGATIVE
Ph: 6 (ref 4.5–8.0)
RBC,UR: 97 /HPF (ref 0–5)

## 2013-05-15 LAB — CBC WITH DIFFERENTIAL/PLATELET
Basophil #: 0 10*3/uL (ref 0.0–0.1)
Basophil %: 0.1 %
Eosinophil %: 0 %
HCT: 43.8 % (ref 35.0–47.0)
HGB: 14.1 g/dL (ref 12.0–16.0)
Lymphocyte %: 4.1 %
MCH: 28 pg (ref 26.0–34.0)
Monocyte #: 1 x10 3/mm — ABNORMAL HIGH (ref 0.2–0.9)
Neutrophil #: 18.6 10*3/uL — ABNORMAL HIGH (ref 1.4–6.5)
Neutrophil %: 90.8 %
Platelet: 288 10*3/uL (ref 150–440)
RBC: 5.05 10*6/uL (ref 3.80–5.20)
RDW: 15.9 % — ABNORMAL HIGH (ref 11.5–14.5)

## 2013-05-15 LAB — BASIC METABOLIC PANEL
Anion Gap: 7 (ref 7–16)
Anion Gap: 6 — ABNORMAL LOW (ref 7–16)
BUN: 26 mg/dL — ABNORMAL HIGH (ref 7–18)
BUN: 30 mg/dL — ABNORMAL HIGH (ref 7–18)
Calcium, Total: 8.6 mg/dL (ref 8.5–10.1)
Calcium, Total: 8.5 mg/dL (ref 8.5–10.1)
Chloride: 110 mmol/L — ABNORMAL HIGH (ref 98–107)
Chloride: 108 mmol/L — ABNORMAL HIGH (ref 98–107)
Co2: 22 mmol/L (ref 21–32)
Co2: 24 mmol/L (ref 21–32)
Creatinine: 1.12 mg/dL (ref 0.60–1.30)
Creatinine: 1.3 mg/dL (ref 0.60–1.30)
EGFR (African American): 58 — ABNORMAL LOW
EGFR (African American): 48 — ABNORMAL LOW
EGFR (Non-African Amer.): 50 — ABNORMAL LOW
Glucose: 135 mg/dL — ABNORMAL HIGH (ref 65–99)
Glucose: 177 mg/dL — ABNORMAL HIGH (ref 65–99)
Osmolality: 284 (ref 275–301)
Osmolality: 286 (ref 275–301)
Potassium: 5.6 mmol/L — ABNORMAL HIGH (ref 3.5–5.1)
Potassium: 4.7 mmol/L (ref 3.5–5.1)
Sodium: 139 mmol/L (ref 136–145)
Sodium: 138 mmol/L (ref 136–145)

## 2013-05-16 LAB — CBC WITH DIFFERENTIAL/PLATELET
Eosinophil #: 0 10*3/uL (ref 0.0–0.7)
HCT: 35.5 % (ref 35.0–47.0)
HGB: 11.7 g/dL — ABNORMAL LOW (ref 12.0–16.0)
Lymphocyte %: 5.2 %
MCV: 85 fL (ref 80–100)
Monocyte #: 2.1 x10 3/mm — ABNORMAL HIGH (ref 0.2–0.9)
Monocyte %: 10.2 %
Neutrophil #: 17.8 10*3/uL — ABNORMAL HIGH (ref 1.4–6.5)
Platelet: 387 10*3/uL (ref 150–440)
WBC: 21 10*3/uL — ABNORMAL HIGH (ref 3.6–11.0)

## 2013-05-16 LAB — COMPREHENSIVE METABOLIC PANEL
Albumin: 2.2 g/dL — ABNORMAL LOW (ref 3.4–5.0)
Alkaline Phosphatase: 108 U/L (ref 50–136)
Anion Gap: 7 (ref 7–16)
BUN: 36 mg/dL — ABNORMAL HIGH (ref 7–18)
Chloride: 107 mmol/L (ref 98–107)
Creatinine: 1.3 mg/dL (ref 0.60–1.30)
EGFR (African American): 48 — ABNORMAL LOW
EGFR (Non-African Amer.): 42 — ABNORMAL LOW
Glucose: 148 mg/dL — ABNORMAL HIGH (ref 65–99)
Potassium: 4.4 mmol/L (ref 3.5–5.1)
SGOT(AST): 16 U/L (ref 15–37)
SGPT (ALT): 13 U/L (ref 12–78)
Total Protein: 5.8 g/dL — ABNORMAL LOW (ref 6.4–8.2)

## 2013-05-16 LAB — CULTURE, BLOOD (SINGLE)

## 2013-05-17 LAB — URINALYSIS, COMPLETE
Bacteria: NONE SEEN
Bilirubin,UR: NEGATIVE
Glucose,UR: NEGATIVE mg/dL (ref 0–75)
Ketone: NEGATIVE
Protein: NEGATIVE
RBC,UR: 2 /HPF (ref 0–5)

## 2013-05-17 LAB — BASIC METABOLIC PANEL
Anion Gap: 7 (ref 7–16)
BUN: 34 mg/dL — ABNORMAL HIGH (ref 7–18)
Calcium, Total: 7.9 mg/dL — ABNORMAL LOW (ref 8.5–10.1)
Creatinine: 1.39 mg/dL — ABNORMAL HIGH (ref 0.60–1.30)
EGFR (Non-African Amer.): 39 — ABNORMAL LOW
Glucose: 96 mg/dL (ref 65–99)
Osmolality: 287 (ref 275–301)
Potassium: 4.3 mmol/L (ref 3.5–5.1)
Sodium: 140 mmol/L (ref 136–145)

## 2013-05-17 LAB — CBC WITH DIFFERENTIAL/PLATELET
Bands: 3 %
HGB: 11.4 g/dL — ABNORMAL LOW (ref 12.0–16.0)
Lymphocytes: 19 %
MCH: 28 pg (ref 26.0–34.0)
Metamyelocyte: 1 %
Monocytes: 8 %
NRBC/100 WBC: 1 /
Platelet: 380 10*3/uL (ref 150–440)
RBC: 4.07 10*6/uL (ref 3.80–5.20)
RDW: 16 % — ABNORMAL HIGH (ref 11.5–14.5)
Segmented Neutrophils: 69 %
WBC: 14.3 10*3/uL — ABNORMAL HIGH (ref 3.6–11.0)

## 2013-05-17 LAB — CLOSTRIDIUM DIFFICILE BY PCR

## 2013-05-18 LAB — CBC WITH DIFFERENTIAL/PLATELET
Bands: 1 %
HCT: 35 % (ref 35.0–47.0)
Lymphocytes: 15 %
MCH: 28.1 pg (ref 26.0–34.0)
MCHC: 33.4 g/dL (ref 32.0–36.0)
MCV: 84 fL (ref 80–100)
Monocytes: 12 %
Platelet: 395 10*3/uL (ref 150–440)
RBC: 4.15 10*6/uL (ref 3.80–5.20)
Segmented Neutrophils: 70 %

## 2013-05-18 LAB — BASIC METABOLIC PANEL
Calcium, Total: 8.1 mg/dL — ABNORMAL LOW (ref 8.5–10.1)
Co2: 28 mmol/L (ref 21–32)
Creatinine: 1 mg/dL (ref 0.60–1.30)
Glucose: 78 mg/dL (ref 65–99)
Osmolality: 282 (ref 275–301)

## 2013-05-19 LAB — CBC WITH DIFFERENTIAL/PLATELET
Eosinophil #: 0 10*3/uL (ref 0.0–0.7)
Eosinophil %: 0.1 %
HCT: 34.4 % — ABNORMAL LOW (ref 35.0–47.0)
MCH: 28.3 pg (ref 26.0–34.0)
Monocyte #: 1.3 x10 3/mm — ABNORMAL HIGH (ref 0.2–0.9)
Monocyte %: 8.2 %
Neutrophil #: 13.1 10*3/uL — ABNORMAL HIGH (ref 1.4–6.5)
Neutrophil %: 80.3 %
RDW: 15.4 % — ABNORMAL HIGH (ref 11.5–14.5)
WBC: 16.4 10*3/uL — ABNORMAL HIGH (ref 3.6–11.0)

## 2013-05-19 LAB — BASIC METABOLIC PANEL
Anion Gap: 5 — ABNORMAL LOW (ref 7–16)
Calcium, Total: 8.4 mg/dL — ABNORMAL LOW (ref 8.5–10.1)
Chloride: 106 mmol/L (ref 98–107)
EGFR (Non-African Amer.): 60 — ABNORMAL LOW
Glucose: 98 mg/dL (ref 65–99)
Potassium: 3.7 mmol/L (ref 3.5–5.1)

## 2013-07-23 ENCOUNTER — Inpatient Hospital Stay: Payer: Self-pay | Admitting: Internal Medicine

## 2013-07-23 LAB — CBC
HCT: 37.3 % (ref 35.0–47.0)
HGB: 12.3 g/dL (ref 12.0–16.0)
MCHC: 33 g/dL (ref 32.0–36.0)
MCV: 84 fL (ref 80–100)
Platelet: 279 10*3/uL (ref 150–440)

## 2013-07-23 LAB — COMPREHENSIVE METABOLIC PANEL
Albumin: 2.9 g/dL — ABNORMAL LOW (ref 3.4–5.0)
Alkaline Phosphatase: 107 U/L
BUN: 23 mg/dL — ABNORMAL HIGH (ref 7–18)
Calcium, Total: 8.2 mg/dL — ABNORMAL LOW (ref 8.5–10.1)
Chloride: 107 mmol/L (ref 98–107)
Creatinine: 1.23 mg/dL (ref 0.60–1.30)
EGFR (Non-African Amer.): 45 — ABNORMAL LOW
Glucose: 82 mg/dL (ref 65–99)
Osmolality: 286 (ref 275–301)
Potassium: 2.9 mmol/L — ABNORMAL LOW (ref 3.5–5.1)
Sodium: 142 mmol/L (ref 136–145)
Total Protein: 6.2 g/dL — ABNORMAL LOW (ref 6.4–8.2)

## 2013-07-23 LAB — TROPONIN I: Troponin-I: <0.02 ng/mL

## 2013-07-24 LAB — CBC WITH DIFFERENTIAL/PLATELET
Basophil %: 0 %
Eosinophil %: 0.1 %
HCT: 33.4 % — ABNORMAL LOW (ref 35.0–47.0)
HGB: 11 g/dL — ABNORMAL LOW (ref 12.0–16.0)
Lymphocyte #: 0.7 10*3/uL — ABNORMAL LOW (ref 1.0–3.6)
Lymphocyte %: 3 %
MCH: 27.7 pg (ref 26.0–34.0)
MCV: 84 fL (ref 80–100)
Monocyte %: 2.7 %
Neutrophil #: 23.3 10*3/uL — ABNORMAL HIGH (ref 1.4–6.5)
Neutrophil %: 94.2 %
Platelet: 264 10*3/uL (ref 150–440)
RBC: 3.95 10*6/uL (ref 3.80–5.20)
RDW: 16.3 % — ABNORMAL HIGH (ref 11.5–14.5)

## 2013-07-24 LAB — BASIC METABOLIC PANEL
BUN: 20 mg/dL — ABNORMAL HIGH (ref 7–18)
Calcium, Total: 8.4 mg/dL — ABNORMAL LOW (ref 8.5–10.1)
Co2: 25 mmol/L (ref 21–32)
Creatinine: 1.1 mg/dL (ref 0.60–1.30)
EGFR (Non-African Amer.): 51 — ABNORMAL LOW
Glucose: 130 mg/dL — ABNORMAL HIGH (ref 65–99)
Potassium: 4.6 mmol/L (ref 3.5–5.1)

## 2013-07-25 LAB — CBC WITH DIFFERENTIAL/PLATELET
Basophil #: 0 10*3/uL (ref 0.0–0.1)
Eosinophil #: 0 10*3/uL (ref 0.0–0.7)
Eosinophil %: 0.1 %
HGB: 10.7 g/dL — ABNORMAL LOW (ref 12.0–16.0)
MCH: 27.7 pg (ref 26.0–34.0)
MCHC: 33 g/dL (ref 32.0–36.0)
MCV: 84 fL (ref 80–100)
Monocyte #: 1.2 x10 3/mm — ABNORMAL HIGH (ref 0.2–0.9)
Neutrophil #: 23.2 10*3/uL — ABNORMAL HIGH (ref 1.4–6.5)
Neutrophil %: 92.1 %
Platelet: 251 10*3/uL (ref 150–440)
RBC: 3.86 10*6/uL (ref 3.80–5.20)
RDW: 16.7 % — ABNORMAL HIGH (ref 11.5–14.5)
WBC: 25.1 10*3/uL — ABNORMAL HIGH (ref 3.6–11.0)

## 2013-07-25 LAB — BASIC METABOLIC PANEL
BUN: 25 mg/dL — ABNORMAL HIGH (ref 7–18)
Calcium, Total: 8.6 mg/dL (ref 8.5–10.1)
Chloride: 110 mmol/L — ABNORMAL HIGH (ref 98–107)
EGFR (Non-African Amer.): 45 — ABNORMAL LOW
Osmolality: 286 (ref 275–301)
Potassium: 4.6 mmol/L (ref 3.5–5.1)
Sodium: 140 mmol/L (ref 136–145)

## 2013-07-26 LAB — CBC WITH DIFFERENTIAL/PLATELET
Basophil #: 0 10*3/uL (ref 0.0–0.1)
Basophil %: 0.1 %
Eosinophil #: 0 10*3/uL (ref 0.0–0.7)
Eosinophil %: 0 %
HCT: 32.9 % — ABNORMAL LOW (ref 35.0–47.0)
Lymphocyte #: 0.5 10*3/uL — ABNORMAL LOW (ref 1.0–3.6)
Lymphocyte %: 2 %
MCHC: 32.6 g/dL (ref 32.0–36.0)
MCV: 83 fL (ref 80–100)
Monocyte %: 4.8 %
Neutrophil #: 21.5 10*3/uL — ABNORMAL HIGH (ref 1.4–6.5)
Neutrophil %: 93.1 %
RDW: 16.7 % — ABNORMAL HIGH (ref 11.5–14.5)
WBC: 23.1 10*3/uL — ABNORMAL HIGH (ref 3.6–11.0)

## 2013-07-26 LAB — BASIC METABOLIC PANEL
Anion Gap: 6 — ABNORMAL LOW (ref 7–16)
Calcium, Total: 8.8 mg/dL (ref 8.5–10.1)
Creatinine: 1.46 mg/dL — ABNORMAL HIGH (ref 0.60–1.30)
EGFR (African American): 42 — ABNORMAL LOW
Glucose: 165 mg/dL — ABNORMAL HIGH (ref 65–99)
Sodium: 137 mmol/L (ref 136–145)

## 2013-07-27 LAB — BASIC METABOLIC PANEL
Anion Gap: 8 (ref 7–16)
Calcium, Total: 9.2 mg/dL (ref 8.5–10.1)
Chloride: 104 mmol/L (ref 98–107)
Co2: 21 mmol/L (ref 21–32)
Glucose: 149 mg/dL — ABNORMAL HIGH (ref 65–99)
Osmolality: 282 (ref 275–301)

## 2013-07-27 LAB — CBC WITH DIFFERENTIAL/PLATELET
Basophil %: 0.2 %
Eosinophil %: 0.1 %
HCT: 35.4 % (ref 35.0–47.0)
HGB: 11.5 g/dL — ABNORMAL LOW (ref 12.0–16.0)
Lymphocyte #: 0.5 10*3/uL — ABNORMAL LOW (ref 1.0–3.6)
Lymphocyte %: 3.1 %
MCHC: 32.4 g/dL (ref 32.0–36.0)
MCV: 85 fL (ref 80–100)
Monocyte #: 1 x10 3/mm — ABNORMAL HIGH (ref 0.2–0.9)
Monocyte %: 5.9 %
Neutrophil #: 15.7 10*3/uL — ABNORMAL HIGH (ref 1.4–6.5)
RBC: 4.17 10*6/uL (ref 3.80–5.20)
RDW: 16.5 % — ABNORMAL HIGH (ref 11.5–14.5)
WBC: 17.3 10*3/uL — ABNORMAL HIGH (ref 3.6–11.0)

## 2013-07-28 LAB — CBC WITH DIFFERENTIAL/PLATELET
HCT: 33.1 % — ABNORMAL LOW (ref 35.0–47.0)
MCH: 26.9 pg (ref 26.0–34.0)
MCV: 83 fL (ref 80–100)
Metamyelocyte: 2 %
Platelet: 247 10*3/uL (ref 150–440)
RBC: 3.99 10*6/uL (ref 3.80–5.20)
RDW: 16.2 % — ABNORMAL HIGH (ref 11.5–14.5)

## 2013-07-28 LAB — BASIC METABOLIC PANEL
Anion Gap: 7 (ref 7–16)
BUN: 60 mg/dL — ABNORMAL HIGH (ref 7–18)
Chloride: 106 mmol/L (ref 98–107)
EGFR (African American): 35 — ABNORMAL LOW
Glucose: 153 mg/dL — ABNORMAL HIGH (ref 65–99)
Osmolality: 290 (ref 275–301)

## 2013-07-28 LAB — CULTURE, BLOOD (SINGLE)

## 2013-07-29 LAB — CBC WITH DIFFERENTIAL/PLATELET
HCT: 32 % — ABNORMAL LOW (ref 35.0–47.0)
HGB: 10.5 g/dL — ABNORMAL LOW (ref 12.0–16.0)
Lymphocytes: 7 %
MCH: 27.5 pg (ref 26.0–34.0)
Metamyelocyte: 2 %
Platelet: 240 10*3/uL (ref 150–440)
RBC: 3.83 10*6/uL (ref 3.80–5.20)

## 2013-07-29 LAB — BASIC METABOLIC PANEL
Anion Gap: 5 — ABNORMAL LOW (ref 7–16)
BUN: 58 mg/dL — ABNORMAL HIGH (ref 7–18)
Calcium, Total: 8.8 mg/dL (ref 8.5–10.1)
Co2: 23 mmol/L (ref 21–32)
Creatinine: 1.6 mg/dL — ABNORMAL HIGH (ref 0.60–1.30)
EGFR (African American): 38 — ABNORMAL LOW
Glucose: 161 mg/dL — ABNORMAL HIGH (ref 65–99)
Potassium: 5.1 mmol/L (ref 3.5–5.1)
Sodium: 136 mmol/L (ref 136–145)

## 2013-07-30 LAB — CBC WITH DIFFERENTIAL/PLATELET
Basophil #: 0 10*3/uL (ref 0.0–0.1)
Basophil %: 0.2 %
Eosinophil #: 0 10*3/uL (ref 0.0–0.7)
Eosinophil %: 0 %
HCT: 34.1 % — ABNORMAL LOW (ref 35.0–47.0)
HGB: 11.1 g/dL — ABNORMAL LOW (ref 12.0–16.0)
Lymphocyte #: 0.5 10*3/uL — ABNORMAL LOW (ref 1.0–3.6)
MCH: 27.1 pg (ref 26.0–34.0)
MCV: 83 fL (ref 80–100)
Monocyte #: 1 x10 3/mm — ABNORMAL HIGH (ref 0.2–0.9)
Monocyte %: 5.3 %
Neutrophil #: 16.8 10*3/uL — ABNORMAL HIGH (ref 1.4–6.5)
RBC: 4.09 10*6/uL (ref 3.80–5.20)
WBC: 18.3 10*3/uL — ABNORMAL HIGH (ref 3.6–11.0)

## 2013-07-30 LAB — BASIC METABOLIC PANEL
Anion Gap: 6 — ABNORMAL LOW (ref 7–16)
BUN: 48 mg/dL — ABNORMAL HIGH (ref 7–18)
Calcium, Total: 8.5 mg/dL (ref 8.5–10.1)
Chloride: 107 mmol/L (ref 98–107)
Co2: 23 mmol/L (ref 21–32)
EGFR (Non-African Amer.): 48 — ABNORMAL LOW
Osmolality: 289 (ref 275–301)
Potassium: 5.3 mmol/L — ABNORMAL HIGH (ref 3.5–5.1)
Sodium: 136 mmol/L (ref 136–145)

## 2013-07-31 LAB — CBC WITH DIFFERENTIAL/PLATELET
Comment - H1-Com4: NORMAL
HCT: 33.6 % — ABNORMAL LOW (ref 35.0–47.0)
HGB: 10.9 g/dL — ABNORMAL LOW (ref 12.0–16.0)
Lymphocytes: 2 %
MCH: 27 pg (ref 26.0–34.0)
MCHC: 32.3 g/dL (ref 32.0–36.0)
Metamyelocyte: 1 %
Platelet: 245 10*3/uL (ref 150–440)
RBC: 4.03 10*6/uL (ref 3.80–5.20)
RDW: 16.4 % — ABNORMAL HIGH (ref 11.5–14.5)
Segmented Neutrophils: 93 %
WBC: 13.6 10*3/uL — ABNORMAL HIGH (ref 3.6–11.0)

## 2013-07-31 LAB — BASIC METABOLIC PANEL
BUN: 41 mg/dL — ABNORMAL HIGH (ref 7–18)
Chloride: 109 mmol/L — ABNORMAL HIGH (ref 98–107)
Creatinine: 1.08 mg/dL (ref 0.60–1.30)
EGFR (African American): >60
EGFR (Non-African Amer.): 52 — ABNORMAL LOW
Glucose: 151 mg/dL — ABNORMAL HIGH (ref 65–99)
Osmolality: 292 (ref 275–301)
Potassium: 4.8 mmol/L (ref 3.5–5.1)
Sodium: 140 mmol/L (ref 136–145)

## 2013-08-01 LAB — BASIC METABOLIC PANEL
Calcium, Total: 7.9 mg/dL — ABNORMAL LOW (ref 8.5–10.1)
Co2: 23 mmol/L (ref 21–32)
Creatinine: 1.19 mg/dL (ref 0.60–1.30)
Glucose: 213 mg/dL — ABNORMAL HIGH (ref 65–99)
Osmolality: 293 (ref 275–301)
Potassium: 4.5 mmol/L (ref 3.5–5.1)

## 2013-08-01 LAB — CBC WITH DIFFERENTIAL/PLATELET
Bands: 1 %
HCT: 34.8 % — ABNORMAL LOW (ref 35.0–47.0)
HGB: 11.4 g/dL — ABNORMAL LOW (ref 12.0–16.0)
Lymphocytes: 2 %
MCHC: 32.7 g/dL (ref 32.0–36.0)
MCV: 83 fL (ref 80–100)
Monocytes: 3 %
Platelet: 258 10*3/uL (ref 150–440)
Segmented Neutrophils: 92 %
WBC: 14.4 10*3/uL — ABNORMAL HIGH (ref 3.6–11.0)

## 2013-08-02 LAB — BASIC METABOLIC PANEL
Anion Gap: 5 — ABNORMAL LOW (ref 7–16)
BUN: 34 mg/dL — ABNORMAL HIGH (ref 7–18)
Calcium, Total: 8.2 mg/dL — ABNORMAL LOW (ref 8.5–10.1)
Chloride: 109 mmol/L — ABNORMAL HIGH (ref 98–107)
Co2: 24 mmol/L (ref 21–32)
Glucose: 179 mg/dL — ABNORMAL HIGH (ref 65–99)
Osmolality: 288 (ref 275–301)
Sodium: 138 mmol/L (ref 136–145)

## 2013-08-02 LAB — CBC WITH DIFFERENTIAL/PLATELET
HCT: 32.3 % — ABNORMAL LOW (ref 35.0–47.0)
MCHC: 33.1 g/dL (ref 32.0–36.0)
MCV: 83 fL (ref 80–100)
Monocytes: 2 %
Myelocyte: 1 %
Platelet: 241 10*3/uL (ref 150–440)
RDW: 16.4 % — ABNORMAL HIGH (ref 11.5–14.5)
WBC: 13.4 10*3/uL — ABNORMAL HIGH (ref 3.6–11.0)

## 2013-08-03 ENCOUNTER — Encounter: Payer: Self-pay | Admitting: Internal Medicine

## 2013-08-12 LAB — URINALYSIS, COMPLETE
Bacteria: NONE SEEN
Blood: NEGATIVE
Glucose,UR: NEGATIVE mg/dL (ref 0–75)
Nitrite: NEGATIVE
Protein: NEGATIVE
Specific Gravity: 1.008 (ref 1.003–1.030)
Squamous Epithelial: 3

## 2013-08-15 LAB — URINE CULTURE

## 2013-08-20 ENCOUNTER — Emergency Department: Payer: Self-pay | Admitting: Emergency Medicine

## 2013-08-26 ENCOUNTER — Encounter: Payer: Self-pay | Admitting: Internal Medicine

## 2013-09-12 ENCOUNTER — Emergency Department: Payer: Self-pay | Admitting: Emergency Medicine

## 2013-09-13 LAB — COMPREHENSIVE METABOLIC PANEL
ALBUMIN: 2.8 g/dL — AB (ref 3.4–5.0)
AST: 25 U/L (ref 15–37)
Alkaline Phosphatase: 106 U/L
Anion Gap: 8 (ref 7–16)
BILIRUBIN TOTAL: 0.2 mg/dL (ref 0.2–1.0)
BUN: 12 mg/dL (ref 7–18)
Calcium, Total: 7.9 mg/dL — ABNORMAL LOW (ref 8.5–10.1)
Chloride: 108 mmol/L — ABNORMAL HIGH (ref 98–107)
Co2: 27 mmol/L (ref 21–32)
Creatinine: 1.45 mg/dL — ABNORMAL HIGH (ref 0.60–1.30)
EGFR (African American): 42 — ABNORMAL LOW
EGFR (Non-African Amer.): 37 — ABNORMAL LOW
Glucose: 99 mg/dL (ref 65–99)
OSMOLALITY: 285 (ref 275–301)
Potassium: 3.3 mmol/L — ABNORMAL LOW (ref 3.5–5.1)
SGPT (ALT): 12 U/L (ref 12–78)
SODIUM: 143 mmol/L (ref 136–145)
Total Protein: 5.9 g/dL — ABNORMAL LOW (ref 6.4–8.2)

## 2013-09-13 LAB — CBC
HCT: 33.2 % — AB (ref 35.0–47.0)
HGB: 10.6 g/dL — ABNORMAL LOW (ref 12.0–16.0)
MCH: 26.4 pg (ref 26.0–34.0)
MCHC: 32 g/dL (ref 32.0–36.0)
MCV: 82 fL (ref 80–100)
Platelet: 315 10*3/uL (ref 150–440)
RBC: 4.03 10*6/uL (ref 3.80–5.20)
RDW: 17.2 % — AB (ref 11.5–14.5)
WBC: 14.8 10*3/uL — ABNORMAL HIGH (ref 3.6–11.0)

## 2013-09-13 LAB — RAPID INFLUENZA A&B ANTIGENS (ARMC ONLY)

## 2013-09-13 LAB — TROPONIN I: Troponin-I: <0.02 ng/mL

## 2013-09-16 ENCOUNTER — Ambulatory Visit: Payer: Self-pay | Admitting: Gastroenterology

## 2013-09-17 LAB — PATHOLOGY REPORT

## 2013-10-14 ENCOUNTER — Ambulatory Visit: Payer: Self-pay | Admitting: Internal Medicine

## 2013-12-17 LAB — CBC WITH DIFFERENTIAL/PLATELET
BASOS ABS: 0.1 10*3/uL (ref 0.0–0.1)
Basophil %: 0.4 %
EOS PCT: 0.3 %
Eosinophil #: 0.1 10*3/uL (ref 0.0–0.7)
HCT: 38.1 % (ref 35.0–47.0)
HGB: 11.9 g/dL — AB (ref 12.0–16.0)
LYMPHS PCT: 6.2 %
Lymphocyte #: 1.4 10*3/uL (ref 1.0–3.6)
MCH: 26.2 pg (ref 26.0–34.0)
MCHC: 31.1 g/dL — ABNORMAL LOW (ref 32.0–36.0)
MCV: 84 fL (ref 80–100)
MONOS PCT: 7.8 %
Monocyte #: 1.8 x10 3/mm — ABNORMAL HIGH (ref 0.2–0.9)
NEUTROS ABS: 19.4 10*3/uL — AB (ref 1.4–6.5)
NEUTROS PCT: 85.3 %
Platelet: 205 10*3/uL (ref 150–440)
RBC: 4.53 10*6/uL (ref 3.80–5.20)
RDW: 17.8 % — ABNORMAL HIGH (ref 11.5–14.5)
WBC: 22.8 10*3/uL — ABNORMAL HIGH (ref 3.6–11.0)

## 2013-12-17 LAB — BASIC METABOLIC PANEL
Anion Gap: 8 (ref 7–16)
BUN: 13 mg/dL (ref 7–18)
Calcium, Total: 8.1 mg/dL — ABNORMAL LOW (ref 8.5–10.1)
Chloride: 106 mmol/L (ref 98–107)
Co2: 28 mmol/L (ref 21–32)
Creatinine: 1.33 mg/dL — ABNORMAL HIGH (ref 0.60–1.30)
EGFR (African American): 47 — ABNORMAL LOW
GFR CALC NON AF AMER: 41 — AB
Glucose: 92 mg/dL (ref 65–99)
OSMOLALITY: 283 (ref 275–301)
Potassium: 3.3 mmol/L — ABNORMAL LOW (ref 3.5–5.1)
Sodium: 142 mmol/L (ref 136–145)

## 2013-12-17 LAB — PROTIME-INR
INR: 1
Prothrombin Time: 13.4 secs (ref 11.5–14.7)

## 2013-12-17 LAB — URINALYSIS, COMPLETE
BACTERIA: NONE SEEN
BILIRUBIN, UR: NEGATIVE
Blood: NEGATIVE
GLUCOSE, UR: NEGATIVE mg/dL (ref 0–75)
Ketone: NEGATIVE
Leukocyte Esterase: NEGATIVE
Nitrite: NEGATIVE
PH: 7 (ref 4.5–8.0)
Protein: NEGATIVE
RBC,UR: <1 /HPF (ref 0–5)
Specific Gravity: 1.012 (ref 1.003–1.030)
WBC UR: <1 /HPF (ref 0–5)

## 2013-12-17 LAB — PHOSPHORUS: Phosphorus: 2.7 mg/dL (ref 2.5–4.9)

## 2013-12-17 LAB — TROPONIN I: Troponin-I: <0.02 ng/mL

## 2013-12-17 LAB — MAGNESIUM: Magnesium: 1.6 mg/dL — ABNORMAL LOW

## 2013-12-18 ENCOUNTER — Inpatient Hospital Stay: Payer: Self-pay | Admitting: Internal Medicine

## 2013-12-19 LAB — CBC WITH DIFFERENTIAL/PLATELET
BASOS ABS: 0.1 10*3/uL (ref 0.0–0.1)
Basophil %: 0.5 %
EOS ABS: 0 10*3/uL (ref 0.0–0.7)
Eosinophil %: 0.1 %
HCT: 35.7 % (ref 35.0–47.0)
HGB: 11.6 g/dL — ABNORMAL LOW (ref 12.0–16.0)
Lymphocyte #: 1.5 10*3/uL (ref 1.0–3.6)
Lymphocyte %: 11.3 %
MCH: 27.4 pg (ref 26.0–34.0)
MCHC: 32.5 g/dL (ref 32.0–36.0)
MCV: 84 fL (ref 80–100)
Monocyte #: 1.2 x10 3/mm — ABNORMAL HIGH (ref 0.2–0.9)
Monocyte %: 8.7 %
Neutrophil #: 10.6 10*3/uL — ABNORMAL HIGH (ref 1.4–6.5)
Neutrophil %: 79.4 %
Platelet: 189 10*3/uL (ref 150–440)
RBC: 4.23 10*6/uL (ref 3.80–5.20)
RDW: 17.3 % — ABNORMAL HIGH (ref 11.5–14.5)
WBC: 13.3 10*3/uL — AB (ref 3.6–11.0)

## 2013-12-19 LAB — BASIC METABOLIC PANEL
ANION GAP: 6 — AB (ref 7–16)
BUN: 9 mg/dL (ref 7–18)
Calcium, Total: 8.4 mg/dL — ABNORMAL LOW (ref 8.5–10.1)
Chloride: 109 mmol/L — ABNORMAL HIGH (ref 98–107)
Co2: 27 mmol/L (ref 21–32)
Creatinine: 0.93 mg/dL (ref 0.60–1.30)
EGFR (African American): >60
EGFR (Non-African Amer.): >60
GLUCOSE: 86 mg/dL (ref 65–99)
Osmolality: 281 (ref 275–301)
POTASSIUM: 3.4 mmol/L — AB (ref 3.5–5.1)
Sodium: 142 mmol/L (ref 136–145)

## 2013-12-20 LAB — CBC WITH DIFFERENTIAL/PLATELET
Basophil #: 0.1 10*3/uL (ref 0.0–0.1)
Basophil %: 1.2 %
EOS ABS: 0 10*3/uL (ref 0.0–0.7)
EOS PCT: 0.2 %
HCT: 33.4 % — ABNORMAL LOW (ref 35.0–47.0)
HGB: 11.2 g/dL — ABNORMAL LOW (ref 12.0–16.0)
LYMPHS ABS: 1.4 10*3/uL (ref 1.0–3.6)
Lymphocyte %: 13.5 %
MCH: 28.1 pg (ref 26.0–34.0)
MCHC: 33.4 g/dL (ref 32.0–36.0)
MCV: 84 fL (ref 80–100)
MONO ABS: 1.4 x10 3/mm — AB (ref 0.2–0.9)
Monocyte %: 13.3 %
NEUTROS ABS: 7.6 10*3/uL — AB (ref 1.4–6.5)
NEUTROS PCT: 71.8 %
Platelet: 205 10*3/uL (ref 150–440)
RBC: 3.97 10*6/uL (ref 3.80–5.20)
RDW: 17.5 % — ABNORMAL HIGH (ref 11.5–14.5)
WBC: 10.6 10*3/uL (ref 3.6–11.0)

## 2013-12-20 LAB — BASIC METABOLIC PANEL
ANION GAP: 7 (ref 7–16)
BUN: 14 mg/dL (ref 7–18)
CALCIUM: 8.6 mg/dL (ref 8.5–10.1)
CHLORIDE: 110 mmol/L — AB (ref 98–107)
Co2: 25 mmol/L (ref 21–32)
Creatinine: 1.13 mg/dL (ref 0.60–1.30)
EGFR (African American): 57 — ABNORMAL LOW
EGFR (Non-African Amer.): 50 — ABNORMAL LOW
Glucose: 95 mg/dL (ref 65–99)
OSMOLALITY: 283 (ref 275–301)
POTASSIUM: 3.9 mmol/L (ref 3.5–5.1)
Sodium: 142 mmol/L (ref 136–145)

## 2013-12-21 LAB — VANCOMYCIN, TROUGH: Vancomycin, Trough: 17 ug/mL (ref 10–20)

## 2013-12-22 LAB — BASIC METABOLIC PANEL
ANION GAP: 8 (ref 7–16)
BUN: 17 mg/dL (ref 7–18)
CALCIUM: 9.2 mg/dL (ref 8.5–10.1)
CHLORIDE: 107 mmol/L (ref 98–107)
Co2: 26 mmol/L (ref 21–32)
Creatinine: 1.09 mg/dL (ref 0.60–1.30)
EGFR (African American): 60 — ABNORMAL LOW
EGFR (Non-African Amer.): 52 — ABNORMAL LOW
Glucose: 134 mg/dL — ABNORMAL HIGH (ref 65–99)
Osmolality: 285 (ref 275–301)
Potassium: 4.2 mmol/L (ref 3.5–5.1)
Sodium: 141 mmol/L (ref 136–145)

## 2013-12-22 LAB — CBC WITH DIFFERENTIAL/PLATELET
COMMENT - H1-COM6: NORMAL
HCT: 34.1 % — AB (ref 35.0–47.0)
HGB: 11.2 g/dL — ABNORMAL LOW (ref 12.0–16.0)
LYMPHS PCT: 9 %
MCH: 27.3 pg (ref 26.0–34.0)
MCHC: 32.8 g/dL (ref 32.0–36.0)
MCV: 83 fL (ref 80–100)
MONOS PCT: 3 %
Myelocyte: 1 %
PLATELETS: 249 10*3/uL (ref 150–440)
RBC: 4.11 10*6/uL (ref 3.80–5.20)
RDW: 17.8 % — AB (ref 11.5–14.5)
Segmented Neutrophils: 87 %
WBC: 10 10*3/uL (ref 3.6–11.0)

## 2013-12-22 LAB — CULTURE, BLOOD (SINGLE)

## 2013-12-23 LAB — CBC WITH DIFFERENTIAL/PLATELET
Basophil #: 0 10*3/uL (ref 0.0–0.1)
Basophil %: 0 %
EOS ABS: 0 10*3/uL (ref 0.0–0.7)
Eosinophil %: 0 %
HCT: 34.8 % — ABNORMAL LOW (ref 35.0–47.0)
HGB: 11.2 g/dL — ABNORMAL LOW (ref 12.0–16.0)
LYMPHS PCT: 6.8 %
Lymphocyte #: 0.8 10*3/uL — ABNORMAL LOW (ref 1.0–3.6)
MCH: 26.7 pg (ref 26.0–34.0)
MCHC: 32.2 g/dL (ref 32.0–36.0)
MCV: 83 fL (ref 80–100)
MONO ABS: 0.8 x10 3/mm (ref 0.2–0.9)
MONOS PCT: 6.6 %
Neutrophil #: 10.7 10*3/uL — ABNORMAL HIGH (ref 1.4–6.5)
Neutrophil %: 86.6 %
Platelet: 308 10*3/uL (ref 150–440)
RBC: 4.19 10*6/uL (ref 3.80–5.20)
RDW: 17.1 % — ABNORMAL HIGH (ref 11.5–14.5)
WBC: 12.4 10*3/uL — AB (ref 3.6–11.0)

## 2013-12-23 LAB — BASIC METABOLIC PANEL
Anion Gap: 5 — ABNORMAL LOW (ref 7–16)
BUN: 21 mg/dL — ABNORMAL HIGH (ref 7–18)
CREATININE: 0.99 mg/dL (ref 0.60–1.30)
Calcium, Total: 8.8 mg/dL (ref 8.5–10.1)
Chloride: 108 mmol/L — ABNORMAL HIGH (ref 98–107)
Co2: 27 mmol/L (ref 21–32)
EGFR (African American): >60
GFR CALC NON AF AMER: 58 — AB
GLUCOSE: 139 mg/dL — AB (ref 65–99)
Osmolality: 285 (ref 275–301)
Potassium: 3.8 mmol/L (ref 3.5–5.1)
Sodium: 140 mmol/L (ref 136–145)

## 2014-01-21 IMAGING — CT CT CHEST W/ CM
1 series · 15 of 34 positions shown, 19 images · IV contrast (APPLIED)
Comparison: none

REASON FOR EXAM: hypoxia, elevated D-dimer
COMMENTS:

PROCEDURE:     CT  - CT CHEST (FOR PE) W  - January 23, 2012  [DATE]
RESULT:     Comparison: CT of the chest, abdomen, and pelvis 09/15/2008
TECHNIQUE: Multiple thin section axial images were obtained from the lung
apices to the upper abdomen following 70 ml Isovue 370 intravenous contrast,
according to the PE protocol. These images were also reviewed on a Siemens
multiplanar work station.

[Series 4: soft tissue · axial · 0.65mm/px · z∈[-195,+24]mm · 15 of 87 slices shown, 19 images]
[im 7/87  mediastinal]
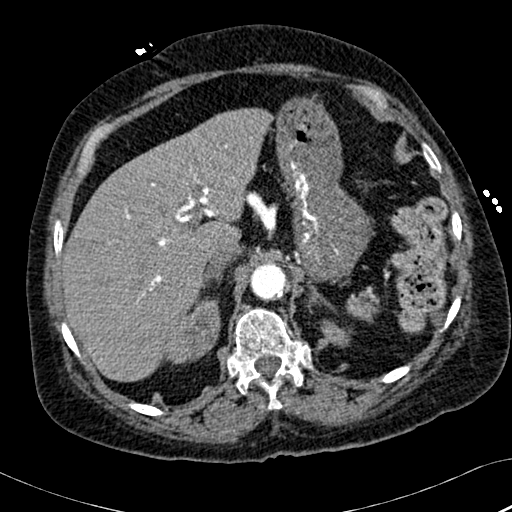
[im 7/87  lung]
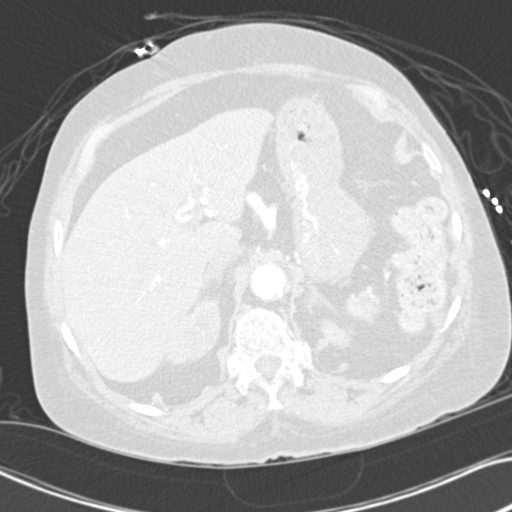
[im 13/87  lung]
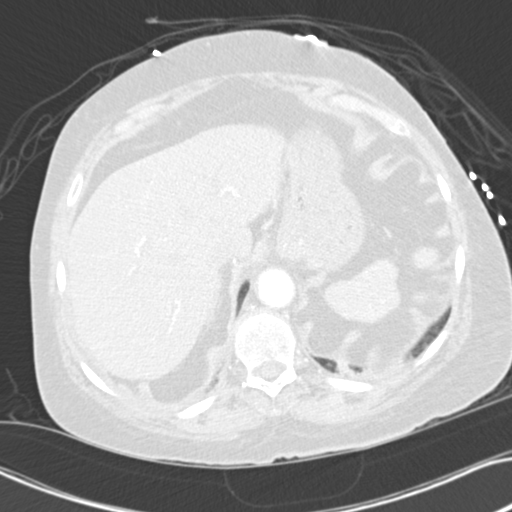
[im 18/87  lung]
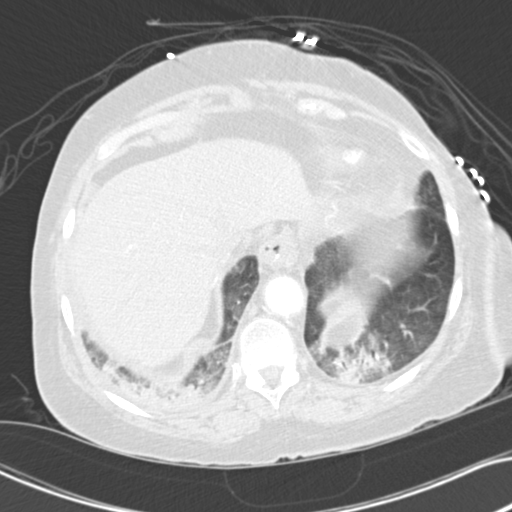
[im 23/87  lung]
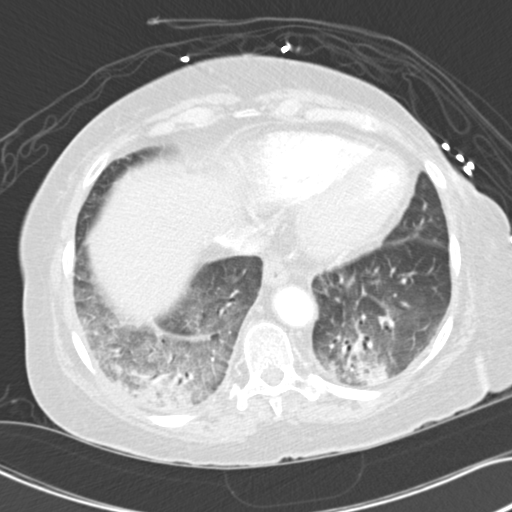
[im 29/87  mediastinal]
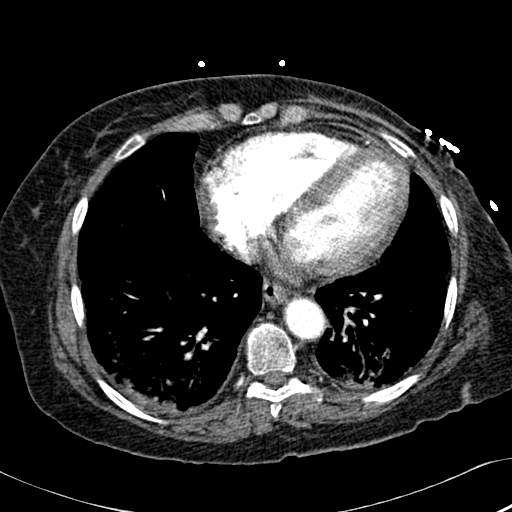
[im 29/87  lung]
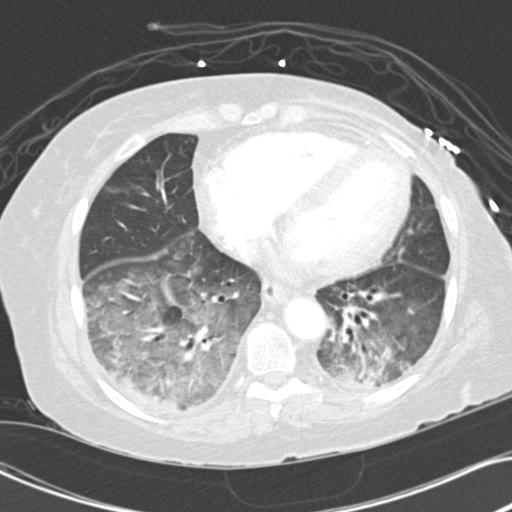
[im 35/87  lung]
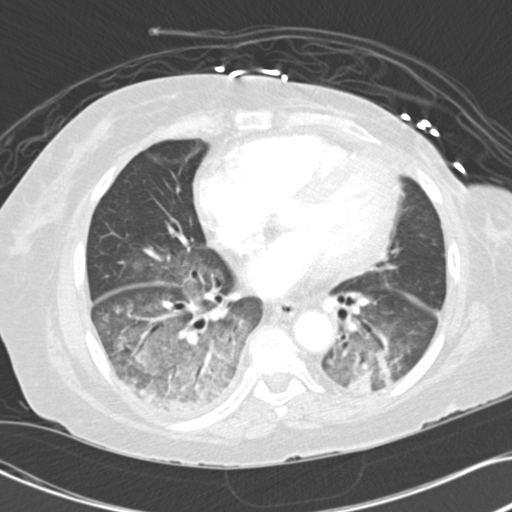
[im 39/87  lung]
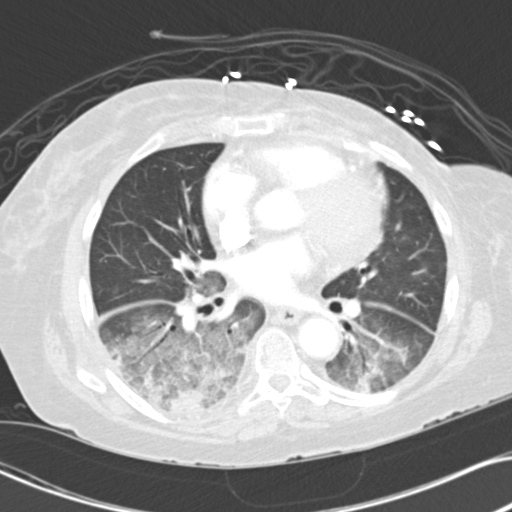
[im 45/87  lung]
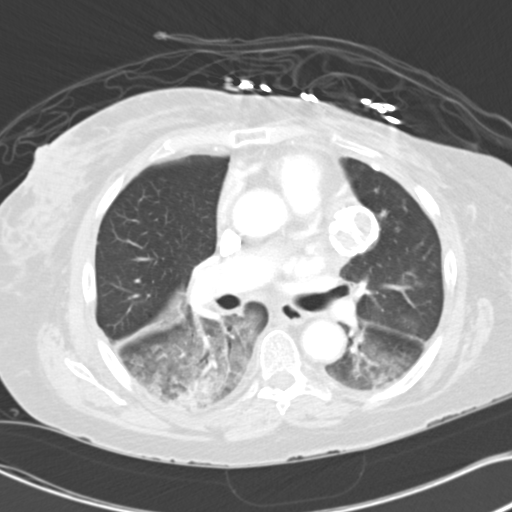
[im 48/87  mediastinal]
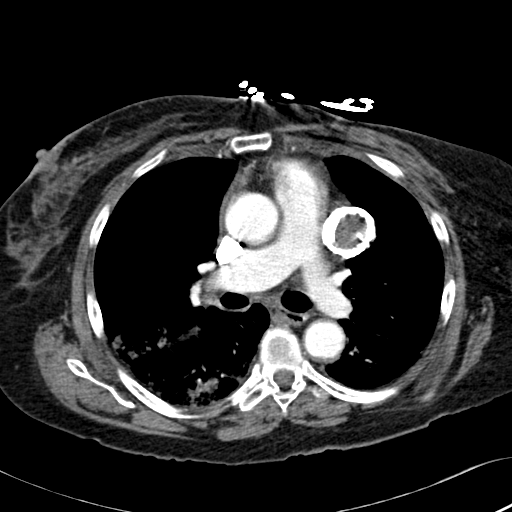
[im 48/87  lung]
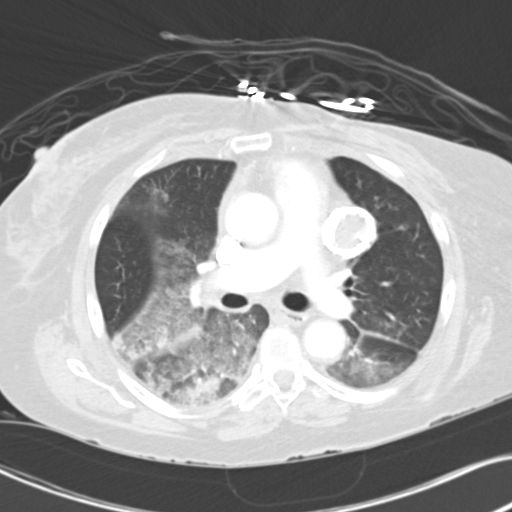
[im 52/87  lung]
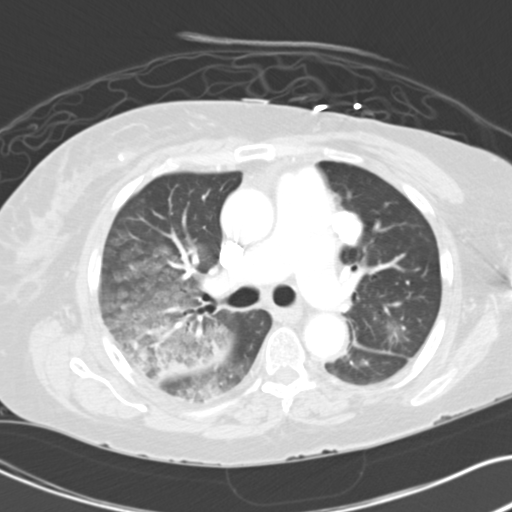
[im 58/87  lung]
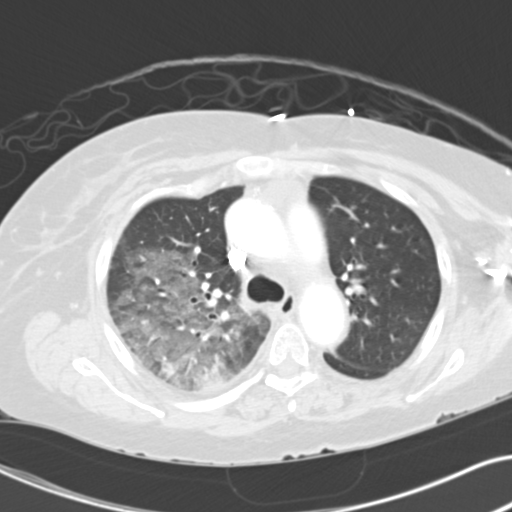
[im 64/87  lung]
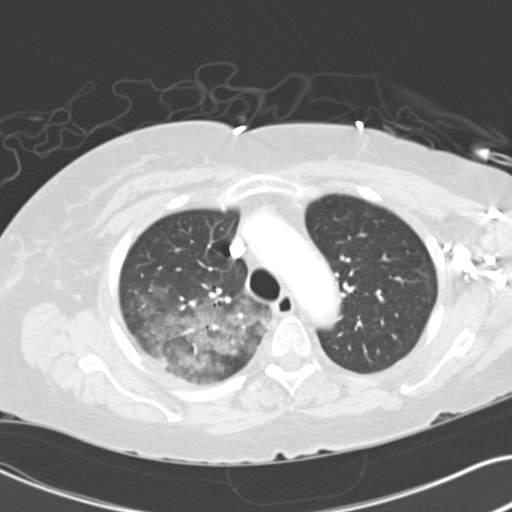
[im 69/87  mediastinal]
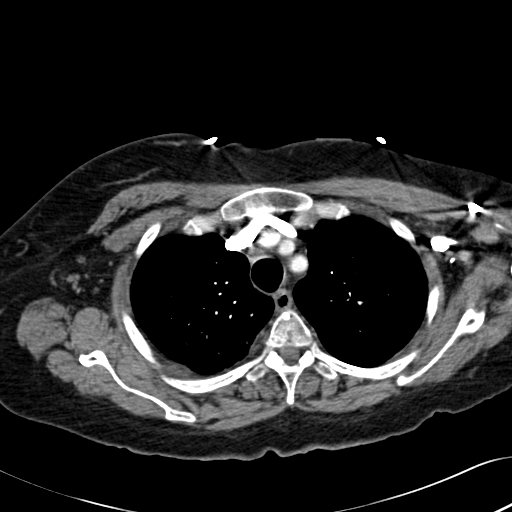
[im 69/87  lung]
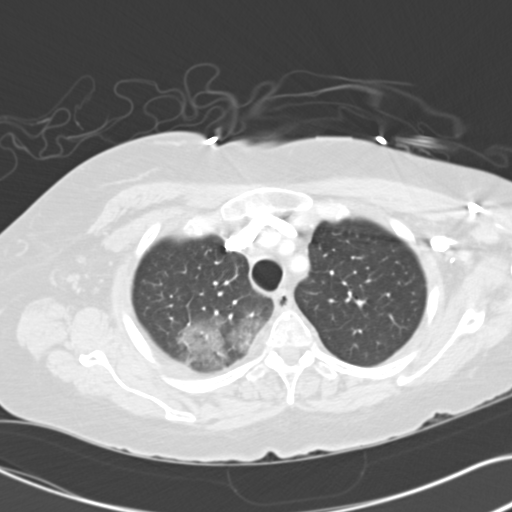
[im 74/87  lung]
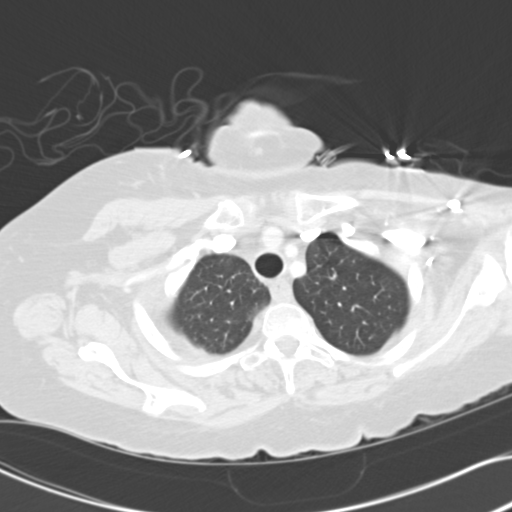
[im 80/87  lung]
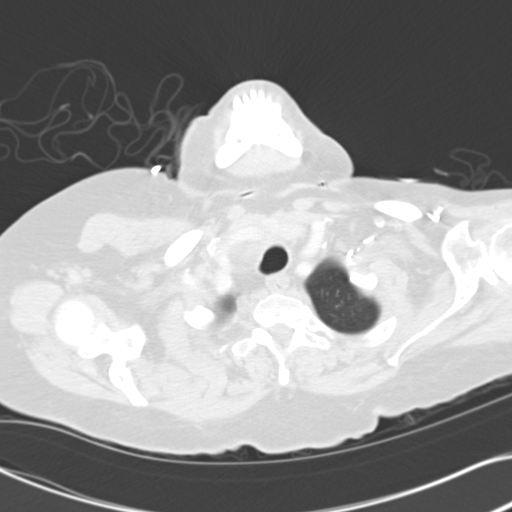

[15 of 34 positions shown; findings below may reference images not displayed]

FINDINGS: No mediastinal, hilar, or axillary lymphadenopathy. There is a peripherally
calcified mass adjacent to the left main pulmonary artery which is similar
to prior. This may represent the sequela of prior treatment. Calcifications
are seen in the coronary arteries. There is a small hiatal hernia. The main
pulmonary artery is enlarged, measuring 3.4 cm in diameter, which can be
seen with pulmonary arterial hypertension.

The thoracic aorta is normal in caliber. Evaluation of the segmental
pulmonary arteries is limited by respiratory motion. Otherwise, no pulmonary
embolus identified.

There are groundglass opacities opacities throughout the majority of the
right upper lobe. These are seen throughout the majority of the right lower
lobe as well. Some ground glass opacities are seen in the left lower lobe.
There are more focal heterogeneous opacities in the dependent lower lobes.

No aggressive lytic or sclerotic osseous lesions are identified. Scattered
round foci of sclerosis in the thoracic skeleton are likely secondary to
bone islands.
IMPRESSION: 1. No pulmonary embolus identified. Evaluation of the segmental pulmonary
arteries is limited.
2. Diffuse ground glass pulmonary opacities, right greater then left. These
findings may related to asymmetric pulmonary edema or other
infectious/inflammatory process.
3. Heterogeneous opacities in the dependent lower lobes may be related to
infection or aspiration.

## 2014-01-31 IMAGING — CR DG CHEST 2V
1 series · 2 of 2 positions shown · non-contrast
Comparison: none

REASON FOR EXAM: right side infiltrate, f/u cxr
COMMENTS:

PROCEDURE:     DXR - DXR CHEST PA (OR AP) AND LATERAL  - February 02, 2012 [DATE]
RESULT:     Comparison: 01/29/2012

[Series 1: w chest pa · 0.14mm/px · 2 of 2 slices shown]
[im 1/2]
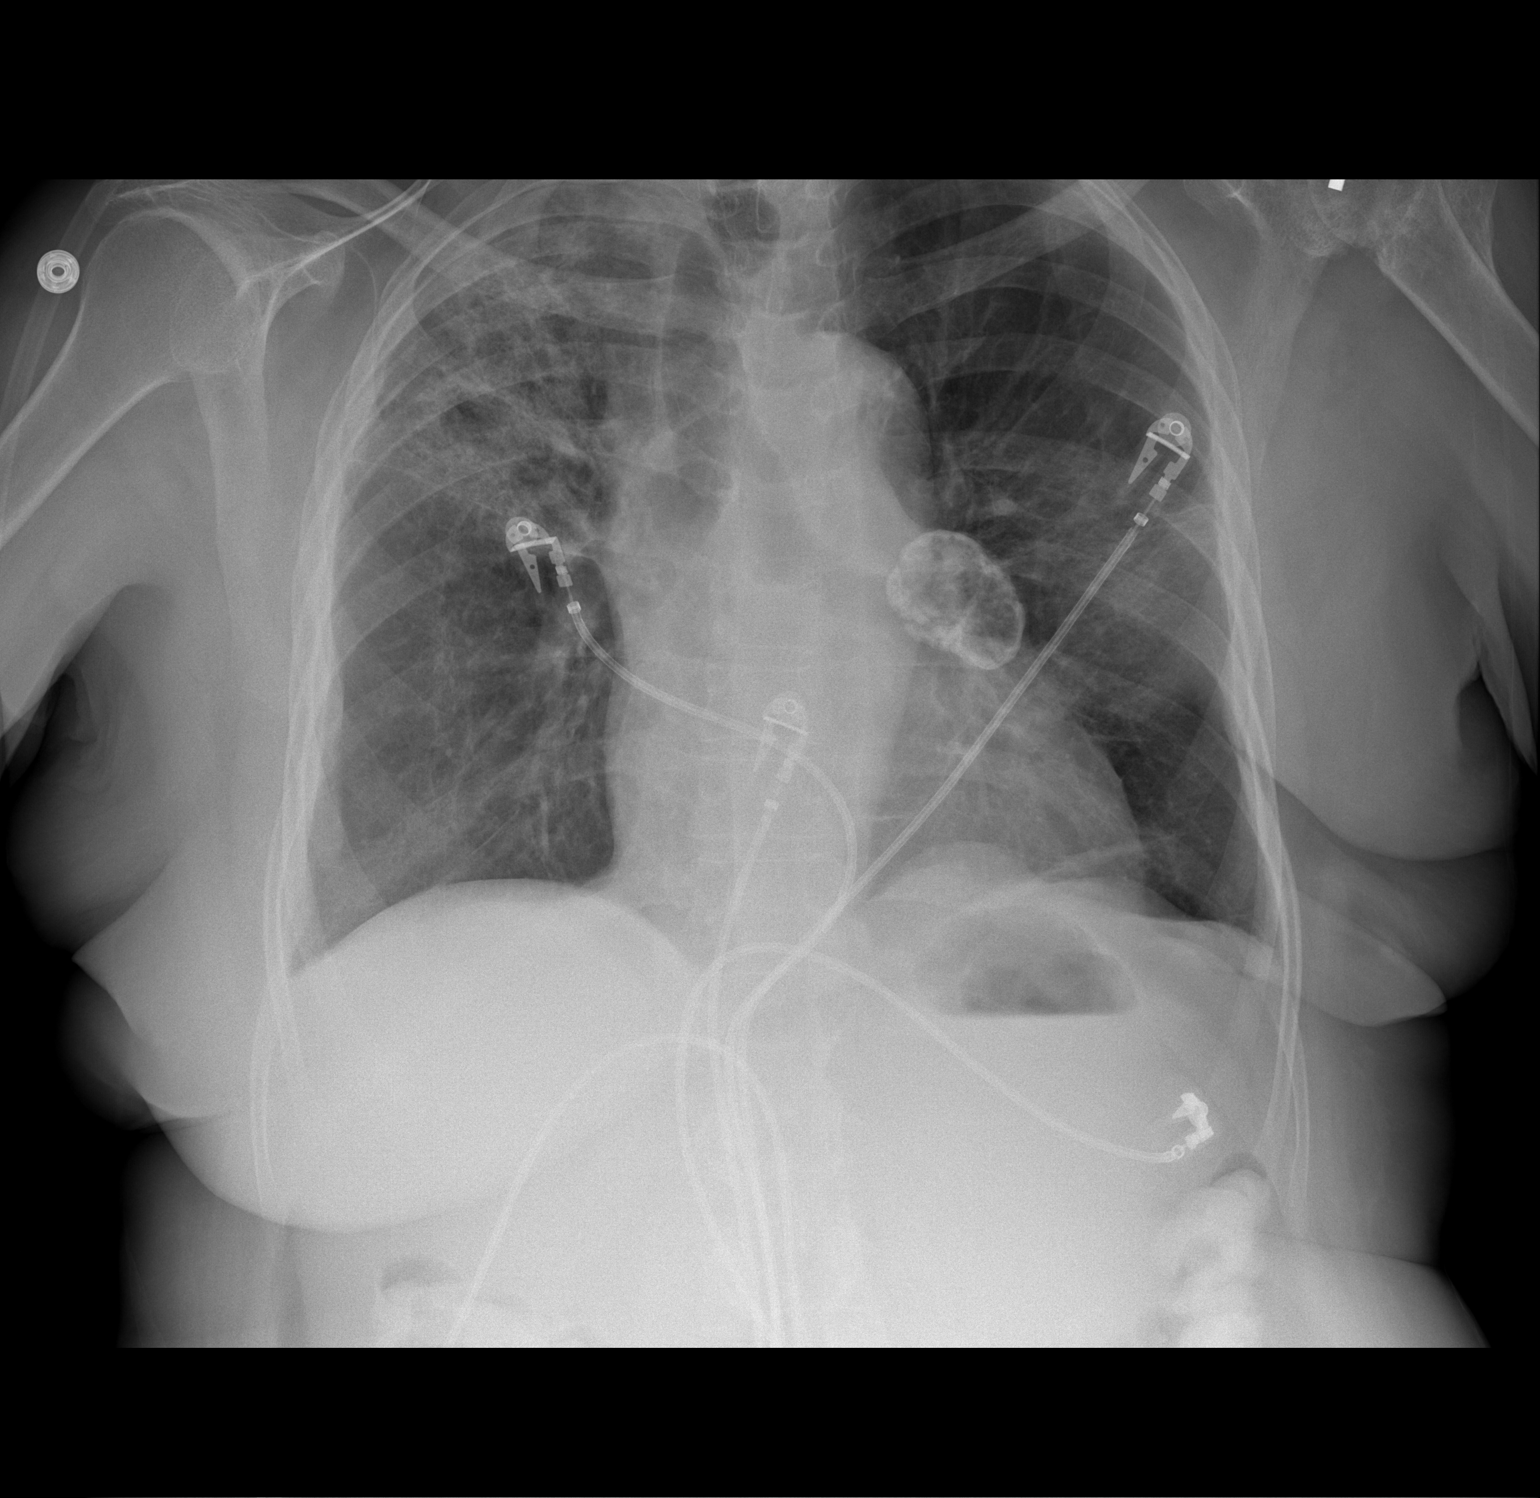
[im 2/2]
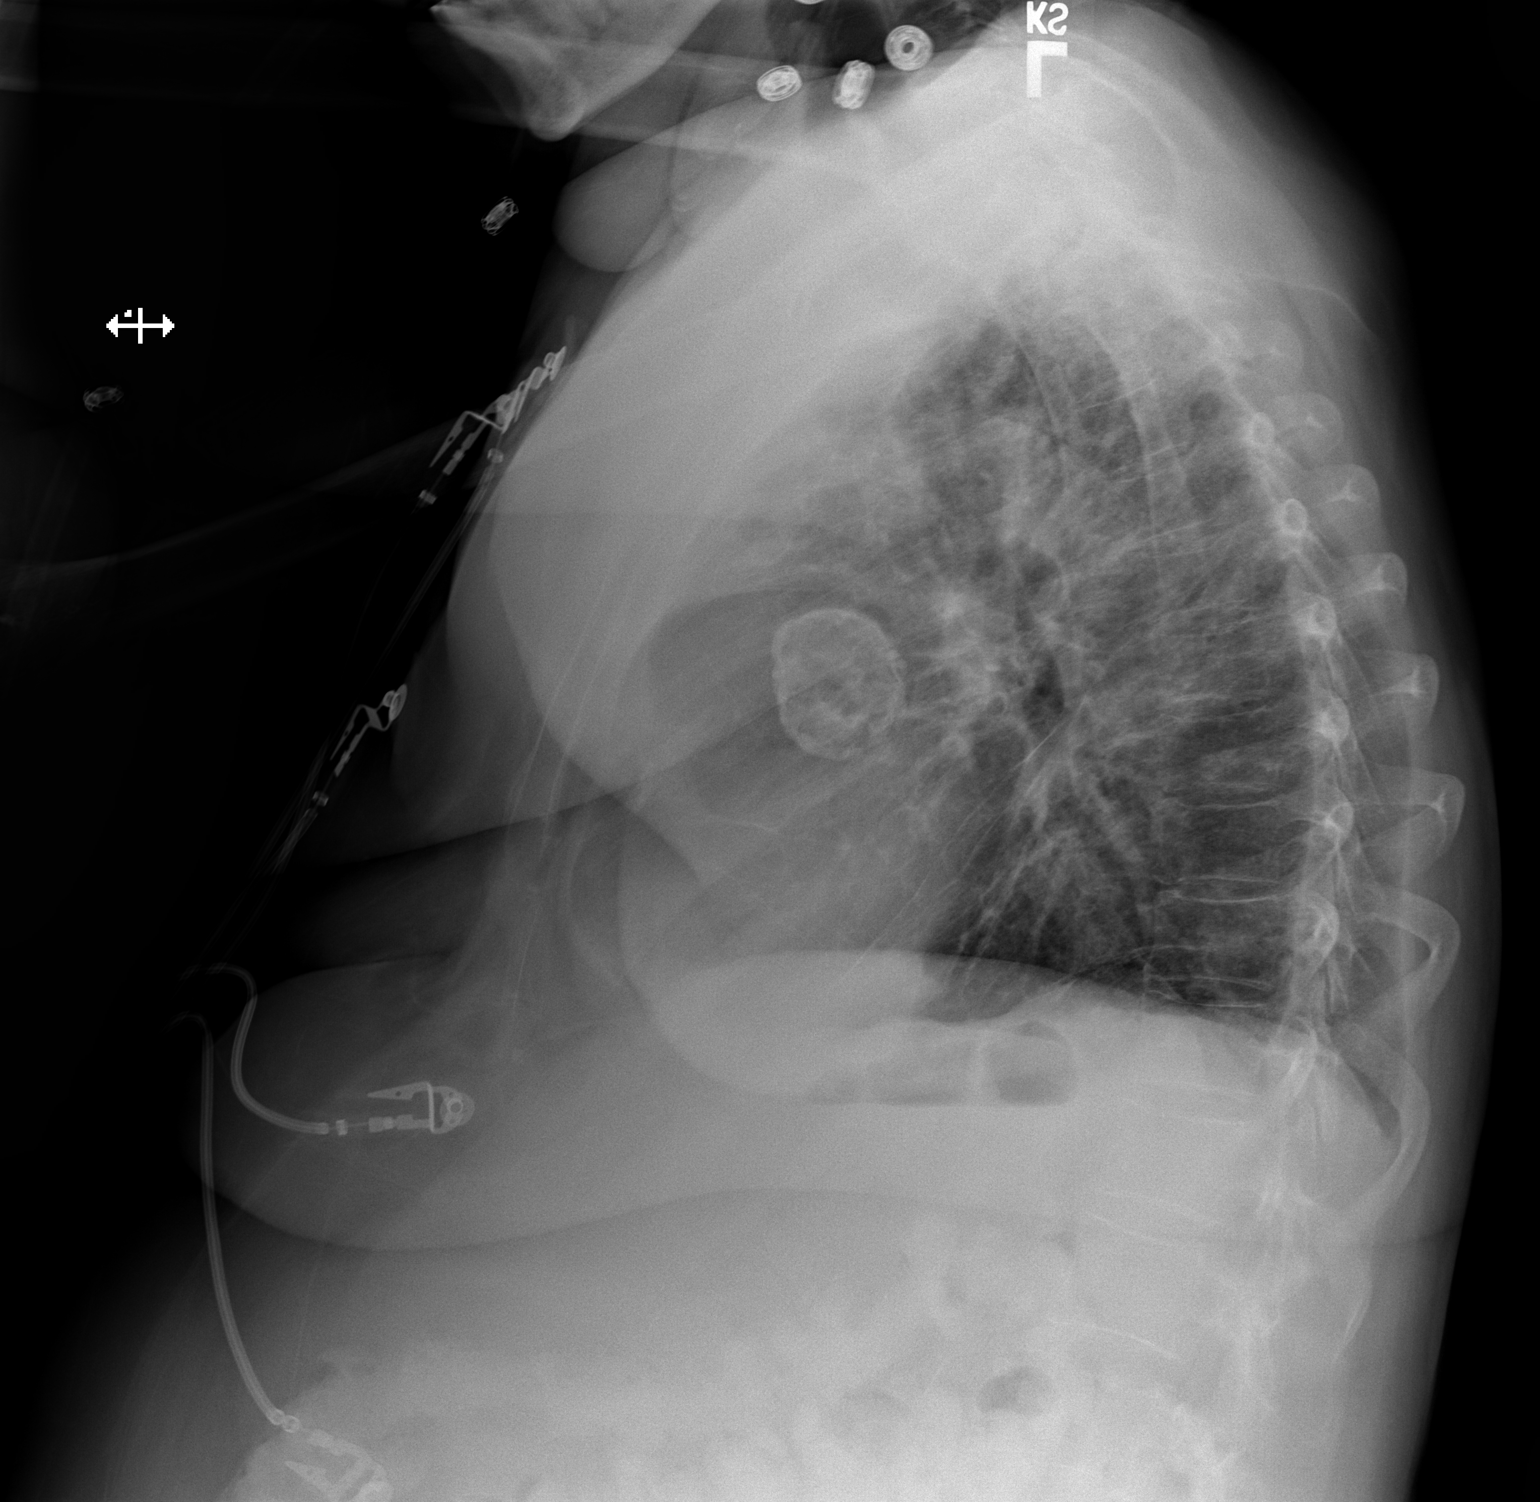

[2 of 2 positions shown; findings below may reference images not displayed]

FINDINGS: PA and lateral chest radiographs are provided. There is persistent right
upper lobe airspace disease which is improved compared with 01/29/2012
consistent with a persistent, but improving right upper lobe pneumonia. The
lungs otherwise clear. There is no pleural effusion or pneumothorax. There
is a calcified left hilar mass likely representing a lymph node unchanged.
Stable heart size.  The osseous structures are unremarkable.
IMPRESSION: Please see above.

[REDACTED]

## 2014-03-01 ENCOUNTER — Inpatient Hospital Stay: Payer: Self-pay | Admitting: Internal Medicine

## 2014-03-01 LAB — CBC WITH DIFFERENTIAL/PLATELET
BASOS PCT: 0.7 %
Basophil #: 0.1 10*3/uL (ref 0.0–0.1)
Eosinophil #: 0 10*3/uL (ref 0.0–0.7)
Eosinophil %: 0.3 %
HCT: 39.9 % (ref 35.0–47.0)
HGB: 12.9 g/dL (ref 12.0–16.0)
Lymphocyte #: 1.3 10*3/uL (ref 1.0–3.6)
Lymphocyte %: 13.3 %
MCH: 27.8 pg (ref 26.0–34.0)
MCHC: 32.2 g/dL (ref 32.0–36.0)
MCV: 86 fL (ref 80–100)
Monocyte #: 1 x10 3/mm — ABNORMAL HIGH (ref 0.2–0.9)
Monocyte %: 9.9 %
Neutrophil #: 7.6 10*3/uL — ABNORMAL HIGH (ref 1.4–6.5)
Neutrophil %: 75.8 %
PLATELETS: 217 10*3/uL (ref 150–440)
RBC: 4.63 10*6/uL (ref 3.80–5.20)
RDW: 15.9 % — ABNORMAL HIGH (ref 11.5–14.5)
WBC: 10 10*3/uL (ref 3.6–11.0)

## 2014-03-01 LAB — COMPREHENSIVE METABOLIC PANEL
ALK PHOS: 104 U/L
ALT: 15 U/L (ref 12–78)
ANION GAP: 6 — AB (ref 7–16)
Albumin: 3.2 g/dL — ABNORMAL LOW (ref 3.4–5.0)
BILIRUBIN TOTAL: 0.3 mg/dL (ref 0.2–1.0)
BUN: 19 mg/dL — AB (ref 7–18)
CO2: 32 mmol/L (ref 21–32)
Calcium, Total: 9.1 mg/dL (ref 8.5–10.1)
Chloride: 102 mmol/L (ref 98–107)
Creatinine: 1.36 mg/dL — ABNORMAL HIGH (ref 0.60–1.30)
EGFR (Non-African Amer.): 39 — ABNORMAL LOW
GFR CALC AF AMER: 46 — AB
Glucose: 122 mg/dL — ABNORMAL HIGH (ref 65–99)
Osmolality: 283 (ref 275–301)
Potassium: 3.4 mmol/L — ABNORMAL LOW (ref 3.5–5.1)
SGOT(AST): 20 U/L (ref 15–37)
SODIUM: 140 mmol/L (ref 136–145)
TOTAL PROTEIN: 7.1 g/dL (ref 6.4–8.2)

## 2014-03-01 LAB — MAGNESIUM: MAGNESIUM: 1.6 mg/dL — AB

## 2014-03-02 LAB — URINALYSIS, COMPLETE
Bilirubin,UR: NEGATIVE
Blood: NEGATIVE
GLUCOSE, UR: NEGATIVE mg/dL (ref 0–75)
KETONE: NEGATIVE
Nitrite: NEGATIVE
PROTEIN: NEGATIVE
Ph: 7 (ref 4.5–8.0)
SPECIFIC GRAVITY: 1.005 (ref 1.003–1.030)
Squamous Epithelial: NONE SEEN

## 2014-03-02 LAB — BASIC METABOLIC PANEL
Anion Gap: 8 (ref 7–16)
BUN: 17 mg/dL (ref 7–18)
Calcium, Total: 8.2 mg/dL — ABNORMAL LOW (ref 8.5–10.1)
Chloride: 105 mmol/L (ref 98–107)
Co2: 32 mmol/L (ref 21–32)
Creatinine: 1.14 mg/dL (ref 0.60–1.30)
EGFR (Non-African Amer.): 49 — ABNORMAL LOW
GFR CALC AF AMER: 56 — AB
GLUCOSE: 81 mg/dL (ref 65–99)
Osmolality: 289 (ref 275–301)
Potassium: 3 mmol/L — ABNORMAL LOW (ref 3.5–5.1)
SODIUM: 145 mmol/L (ref 136–145)

## 2014-03-02 LAB — CBC WITH DIFFERENTIAL/PLATELET
Basophil #: 0 10*3/uL (ref 0.0–0.1)
Basophil %: 0.4 %
Eosinophil #: 0.1 10*3/uL (ref 0.0–0.7)
Eosinophil %: 0.6 %
HCT: 36.2 % (ref 35.0–47.0)
HGB: 11.6 g/dL — ABNORMAL LOW (ref 12.0–16.0)
Lymphocyte #: 2.6 10*3/uL (ref 1.0–3.6)
Lymphocyte %: 24.1 %
MCH: 27.5 pg (ref 26.0–34.0)
MCHC: 31.9 g/dL — ABNORMAL LOW (ref 32.0–36.0)
MCV: 86 fL (ref 80–100)
Monocyte #: 1.3 x10 3/mm — ABNORMAL HIGH (ref 0.2–0.9)
Monocyte %: 11.9 %
Neutrophil #: 6.8 10*3/uL — ABNORMAL HIGH (ref 1.4–6.5)
Neutrophil %: 63 %
Platelet: 204 10*3/uL (ref 150–440)
RBC: 4.21 10*6/uL (ref 3.80–5.20)
RDW: 15.6 % — ABNORMAL HIGH (ref 11.5–14.5)
WBC: 10.8 10*3/uL (ref 3.6–11.0)

## 2014-03-03 LAB — CBC WITH DIFFERENTIAL/PLATELET
BASOS ABS: 0.1 10*3/uL (ref 0.0–0.1)
Basophil %: 0.6 %
Eosinophil #: 0 10*3/uL (ref 0.0–0.7)
Eosinophil %: 0.2 %
HCT: 36.5 % (ref 35.0–47.0)
HGB: 11.7 g/dL — AB (ref 12.0–16.0)
LYMPHS ABS: 2.2 10*3/uL (ref 1.0–3.6)
Lymphocyte %: 24.8 %
MCH: 27.6 pg (ref 26.0–34.0)
MCHC: 32.1 g/dL (ref 32.0–36.0)
MCV: 86 fL (ref 80–100)
Monocyte #: 1.2 x10 3/mm — ABNORMAL HIGH (ref 0.2–0.9)
Monocyte %: 13.4 %
Neutrophil #: 5.4 10*3/uL (ref 1.4–6.5)
Neutrophil %: 61 %
Platelet: 199 10*3/uL (ref 150–440)
RBC: 4.25 10*6/uL (ref 3.80–5.20)
RDW: 15.5 % — AB (ref 11.5–14.5)
WBC: 8.8 10*3/uL (ref 3.6–11.0)

## 2014-03-03 LAB — BASIC METABOLIC PANEL
ANION GAP: 7 (ref 7–16)
BUN: 15 mg/dL (ref 7–18)
CO2: 33 mmol/L — AB (ref 21–32)
Calcium, Total: 8.3 mg/dL — ABNORMAL LOW (ref 8.5–10.1)
Chloride: 103 mmol/L (ref 98–107)
Creatinine: 1.14 mg/dL (ref 0.60–1.30)
EGFR (Non-African Amer.): 49 — ABNORMAL LOW
GFR CALC AF AMER: 56 — AB
Glucose: 79 mg/dL (ref 65–99)
Osmolality: 285 (ref 275–301)
Potassium: 3.2 mmol/L — ABNORMAL LOW (ref 3.5–5.1)
SODIUM: 143 mmol/L (ref 136–145)

## 2014-03-03 LAB — URINE CULTURE

## 2014-03-04 LAB — BASIC METABOLIC PANEL
Anion Gap: 8 (ref 7–16)
BUN: 26 mg/dL — AB (ref 7–18)
CHLORIDE: 104 mmol/L (ref 98–107)
CO2: 31 mmol/L (ref 21–32)
Calcium, Total: 8.3 mg/dL — ABNORMAL LOW (ref 8.5–10.1)
Creatinine: 1.61 mg/dL — ABNORMAL HIGH (ref 0.60–1.30)
EGFR (African American): 37 — ABNORMAL LOW
EGFR (Non-African Amer.): 32 — ABNORMAL LOW
Glucose: 112 mg/dL — ABNORMAL HIGH (ref 65–99)
OSMOLALITY: 290 (ref 275–301)
POTASSIUM: 3.3 mmol/L — AB (ref 3.5–5.1)
Sodium: 143 mmol/L (ref 136–145)

## 2014-03-04 LAB — CBC WITH DIFFERENTIAL/PLATELET
BASOS PCT: 0.6 %
Basophil #: 0.1 10*3/uL (ref 0.0–0.1)
Eosinophil #: 0 10*3/uL (ref 0.0–0.7)
Eosinophil %: 0.6 %
HCT: 34.9 % — ABNORMAL LOW (ref 35.0–47.0)
HGB: 11.6 g/dL — ABNORMAL LOW (ref 12.0–16.0)
LYMPHS ABS: 2.3 10*3/uL (ref 1.0–3.6)
Lymphocyte %: 26.8 %
MCH: 29.2 pg (ref 26.0–34.0)
MCHC: 33.3 g/dL (ref 32.0–36.0)
MCV: 88 fL (ref 80–100)
MONO ABS: 1.1 x10 3/mm — AB (ref 0.2–0.9)
MONOS PCT: 12.5 %
Neutrophil #: 5.1 10*3/uL (ref 1.4–6.5)
Neutrophil %: 59.5 %
PLATELETS: 190 10*3/uL (ref 150–440)
RBC: 3.98 10*6/uL (ref 3.80–5.20)
RDW: 15.3 % — ABNORMAL HIGH (ref 11.5–14.5)
WBC: 8.6 10*3/uL (ref 3.6–11.0)

## 2014-04-06 ENCOUNTER — Emergency Department: Payer: Self-pay | Admitting: Emergency Medicine

## 2014-10-18 ENCOUNTER — Ambulatory Visit: Payer: Self-pay | Admitting: Internal Medicine

## 2014-12-16 NOTE — Consult Note (Signed)
Brief Consult Note: Comments: Psychiatry: Attemped several times today to see patient for consult but she is always on the telephone. Will try back later.  Electronic Signatures: Lavance Beazer, Jackquline Denmark (MD)  (Signed 05-Dec-14 12:17)  Authored: Brief Consult Note   Last Updated: 05-Dec-14 12:17 by Audery Amel (MD)

## 2014-12-16 NOTE — Consult Note (Signed)
Brief Consult Note: Diagnosis: depression nos.   Patient was seen by consultant.   Consult note dictated.   Orders entered.   Comments: Psychiatry: Patient seen and chart reviewed. Patient with chronic anxiety and depression. Currently feels nerves are a little worse but is not delirius or psychotic or manic. I have increased xanax a little and increased seroquel to 200 and ambien to 10mg . Also changed cymbalta to am at her request and changed ppi to nexium at her request. She does not want to go to [facility] but wants to go to [facility] if rehab needed. Full note dictated.  Electronic Signatures: Clapacs, Mattoon (MD)  (Signed 05-Dec-14 18:28)  Authored: Brief Consult Note   Last Updated: 05-Dec-14 18:28 by [id number] (MD)

## 2014-12-16 NOTE — Discharge Summary (Signed)
PATIENT NAME:  Natalie Sweeney, Natalie Sweeney MR#:  [id number] DATE OF BIRTH:  02/11/1946  DATE OF ADMISSION:  12/09/2012 DATE OF DISCHARGE:  12/15/2012  ADMISSION DIAGNOSIS: Acute respiratory failure.   DISCHARGE DIAGNOSES:  1. Acute respiratory failure likely secondary to a combination of probable interstitial disease, pneumonitis and bilateral pneumonia.  2. Sepsis.  3. Depression.  4. Acute on chronic kidney disease stage 3.  5. History of lupus, connective tissue disorder and rheumatoid arthritis.  6. Leukocytosis.  7. Hyponatremia and hypokalemia.   CONSULTS: Dr. Fleming.   LABORATORIES AT DISCHARGE: Sodium 135, potassium 4.1, chloride 103, bicarb 24, BUN 75, creatinine 2.01, glucose is 186, ESR is 34. White blood cells 11, hemoglobin 10.7, hematocrit 33, platelets are 282. Kidney ultrasound showed no evidence of hydronephrosis; it did show medical renal disease.   Chest x-ray on discharge showed persistent right upper lobe density, slightly more conspicuous when compared to the previous study.   Blood cultures are negative to date.   2-D echocardiogram showed ejection fraction of 55 to 60% with moderate aortic regurgitation and tricuspid regurgitation with mildly elevated right arterial pressures.   HOSPITAL COURSE: A 69-year-old female with history of connective tissue disease and rheumatoid arthritis on Plaquenil who presented with acute respiratory failure. For further details, please refer to the H and P.  1. Acute respiratory failure possibly from pneumonitis, bilateral pneumonia versus interstitial lung disease. The patient was initially placed on oxygen by nasal cannula. She had increased respiratory effort on April 18th. She was started on IV Solu-Medrol and given one dose of Lasix. She was also started on high flow oxygen. I suspect that her increased respiratory failure was due to more of an interstitial pattern or pneumonitis rather than pulmonary edema. Her echo did show a normal  ejection fraction. She had some mild diastolic dysfunction. I do not suspect this was acute pulmonary edema or congestive heart failure. She was placed on Levaquin and vancomycin. She did quite well with these. She still needs oxygen at discharge for probable interstitial lung disease. Her CT did show diffuse ground-glass opacities right greater than the left. Her blood cultures are negative to date. Her steroids have been weaned. Her oxygen has been weaned as tolerated. We appreciate Pulmonary consult. The patient will see Dr. Fleming next week in his office. She will need a repeat chest x-ray at that time and then a repeat CT scan in about 2 to 3 months, and at that time, Dr. Fleming will decide whether the patient will need a bronchoscopy for further evaluation for rule out interstitial lung disease. The patient will be discharged home with linezolid and Levaquin.  2. Sepsis on admission which is improved.  3. Lupus, connective tissue disorder and rheumatoid arthritis. The patient was on steroids and Plaquenil. 4. Depression which is stable. 5. Acute on chronic kidney disease. The patient may have had a component of ATN versus hypovolemia from the Lasix that she had received. We stopped any nephrotoxic agents. Her creatinine is improving at 2. She is making very good urine. She will follow up with Dr. Singh just as an outpatient to assure that her creatinine continues to decrease.  6. Hypokalemia and hypomagnesemia which was repleted.  7. Leukocytosis, improved with therapy.  8. Hyponatremia thought to be secondary to hypovolemia which has improved.   DISCHARGE MEDICATIONS:  1. Seroquel XR 150 mg at bedtime.  2. Cymbalta 60 mg daily.  3. ProAir HFA 2 puffs q.4 to 6 hours p.r.n. shortness of breath.    4. Calcitonin 200 mcg nasally daily.  5. Ambien 10 mg at bedtime p.r.n.  6. Diltiazem 360 mg daily.  7. Plaquenil 200 mg 2 tablets daily.  8. Atorvastatin 10 mg daily.  9. Metaxalone 800 mg one-half  tablet every 12 hours p.r.n. for muscle spasms.  10. Gabapentin 300 mg b.i.d.  11. Nexium 20 mg b.i.d.  12. Linezolid 600 mg b.i.d. for four days.  13. Prednisone taper starting at 60 mg, taper x 10 mg every three days, then she will resume her outpatient prednisone 2.5 mg daily.  14. Levaquin 750 mg q.48 hours x 5 days. 15. Xanax 0.5 mg b.i.d.   DISCHARGE INSTRUCTIONS: The patient will not take any ibuprofen or NSAIDs, or any medications to affect kidneys. The patient will be discharged with home health. She did refuse a skilled nursing facility even though this was recommended for her safety. Discharge oxygen: Two liters nasal cannula.   DISCHARGE DIET: Regular.   DISCHARGE FOLLOWUP: The patient will follow up with Dr. Raul Del next week as well as Dr. Clayborn Bigness.   TIME SPENT: Approximately 35 minutes.     ____________________________ Donell Beers. Benjie Karvonen, MD spm:es D: 12/15/2012 14:03:40 ET T: 12/15/2012 14:21:25 ET JOB#: [id number]  cc: Brealynn Contino P. Benjie Karvonen, MD, <Dictator> [place] Blocker, MD Dr. Shella Spearing P Zyron Deeley MD ELECTRONICALLY SIGNED 12/15/2012 20:56

## 2014-12-16 NOTE — Consult Note (Signed)
PATIENT NAME:  Natalie Sweeney, Natalie Sweeney MR#:  [id number] DATE OF BIRTH:  02-13-1946  DATE OF CONSULTATION:  12/10/2012  REFERRING PHYSICIAN:  Krystal Eaton, MD  CONSULTING PHYSICIAN:  Natalie Brissett E. Meredeth Ide, MD  REASON FOR CONSULTATION: Bilateral infiltrates on chest CT scan.   HISTORY OF PRESENT ILLNESS: The patient is a pleasant 69 year old African American lady whom we saw in the office within a month or so for a routine check. At that time, she was noted on chest x-ray to have a small pleural effusion. Was expected to follow back up with [place] for continued followup. In the interim, however, she presented to the hospital on April 14th with shortness of breath, cough and wheezing. In the setting of connective tissue disease/lupus, rheumatoid arthritis, followed by Dr. Lavenia Atlas. On workup, she was noted to have a chest x-ray that showed diffuse right-sided infiltrate with calcified hilar nodes. Question was this all fluid or pneumonia, hence went on for a chest CT scan that day that showed ground-glass density in the lungs, especially on the right as well as the left lower lobe. There was no pneumothorax. Pulmonary artery was somewhat enlarged. There were calcified peripheral lesions adjacent to the left lateral aspect of the main pulmonary artery. Small sliding hiatus hernia was also noted. Looking back on these findings, I also noted in May 2013, as well, an abnormal chest x-ray at that time. She was treated subsequent to that for pneumonia on a chest x-ray, cleared up; and hence, because it cleared up, we assumed that the CT scan changes have also cleared up. Comparing today's chest x-ray and the one that she has in the office, it is significantly advanced. Hence, I suspect that the findings are not totally chronic but recurrent. She is saying that she is feeling better since last night, not having any wheezing, chest tightness or sputum production but still dyspnea with activity. There is no pleurisy. No new  skin lesions. Does have a low-grade temperature, however. Short of that, she denies any new triggers such as birds or other pets in the house. No mold or exposure to any noxious inhalants. No strong history to suggest aspiration.   PAST MEDICAL HISTORY: Lupus, rheumatoid arthritis, hypertension, hyperlipidemia, depression, history of breast cancer status post left mastectomy.  PAST SURGICAL HISTORY: Status post hysterectomy, status post pericardiocentesis in 1979, and left mastectomy.   ALLERGIES:  1. PENICILLIN. 2. SULFA.  CURRENT MEDICATIONS: Nexium 20 mg p.o. 1 cap b.i.d., K-Lor 20 mEq daily, gabapentin 300 mg 1 tablet 2 times a day, Lasix 20 mg p.o. daily p.r.n., Xanax 0.5 mg q.8 hours, metaxalone 800 mg 1/2 tab q.12 hours, atorvastatin 10 mg 1 tablet daily, Plaquenil 200 mg 2 tablets once a day, Flonase nasal spray 2 squirts each nostril daily, diltiazem 360 one tablet daily, Ambien 10 mg at bedtime, prednisone 5 mg 1/2 tab daily, ProAir 2 puffs q.4-6 hours p.r.n., Cymbalta 60 mg daily, Seroquel 150 mg daily, calcitonin 200 International Units 1 spray in alternating nose once a day.   SOCIAL HISTORY: Ex-nicotine abuser. No alcohol. Lives by herself.  FAMILY HISTORY: Noted for cardiac cancer.  REVIEW OF SYSTEMS: Presently, not having any headache, dizziness, passing out spells, change in vision. No sore throat, no change in voice, stiff neck, not having any parotid gland enlargement. She denies any pleurisy. No significant sputum production. There is dyspnea on exertion, better this day than yesterday. No unstable angina, palpitations, pleurisy or hemoptysis. Nonausea, goiter, blood in stools, dysuria, nausea, vomiting, gross  hematuria, flank pain, polyphagia, polydipsia, heat or cold intolerance. No rashes, hair loss. No nose bleeding, bruising, nor is she having any TIAs, seizures, passing out spells. Feels weak, however. No suicidal ideation. The rest of the review was noncontributory.    PHYSICAL EXAMINATION:  VITAL SIGNS: Temperature 98.3, pulse 87, respirations 20, blood pressure 120/72, O2 sat is 94% on 2 liters.  GENERAL: A pleasant lady sitting in a chair, has 2 family members at her bedside. No use of accessory muscles. Slightly frustrated. HEENT: Normocephalic, atraumatic. Extraocular movements are intact. Sclerae are clear. No nystagmus. Oropharynx: There was no thrush, no lesions, no exudates.  NECK: Supple. There was no JVD, no thyromegaly, stridor or parotid gland enlargement.  CHEST: Bilateral breath sounds with rare wheeze, rhonchi and crackles bilaterally, more so on the right.  CARDIAC: Regular rhythm. No ectopy, murmurs, or gallops. ABDOMEN: Soft. No epigastric soreness. No hepatosplenomegaly.  EXTREMITIES: There was no obvious edema, cyanosis, Denna Haggard' signs.  SKIN: No telangiectasia, sclerodactyly or alopecia.  LYMPH NODE: No nodes in neck or supraclavicular area.  NEUROLOGIC: Did not ambulate, but able to raise all extremities against gravity. CRANIAL NERVES: Sensation appeared to be grossly intact.  PSYCHIATRIC: Alert and oriented. Reasonable insight.   LABORATORY DATA: Glucose is 66, BUN is 20, creatinine is 1.06, sodium 145, potassium 3.8, chloride 112, CO2 of 27, magnesium 1.6. WBC is 24.9, hemoglobin 9.9, hematocrit 31.1, platelets 241, MCV 80. Urine culture: Mixed flora consistent with a contaminant. UA shows no significant white or red blood cells. Nitrite and leukocyte esterase were normal. Legionella urine antigen was negative   IMPRESSION AND PLAN: This is a 69 year old lady who is here today with increasing shortness of breath and wheezing, some of which is probably coming from her known chronic obstructive pulmonary disease; hence, I agree with treating her with chronic obstructive pulmonary disease coverage as ordered. Also has bilateral ground-glass infiltrates, more so on the right. It appears to be recurrent, although there may also be a  component of chronicity to some of these findings. Hence, differential includes pneumonia as well as inflammatory process probably from her known connective tissue disease. Hence, I would like to put her on broad-spectrum antibiotics including vancomycin and Levaquin. Primaxin might be an option in addition to the vancomycin. In light of the fact that she  is getting better, I would like to continue as is. Would also repeat a chest x-ray on Monday. If she is getting any worse, I think that we need to move on to bronchoscopy and also to include Rheumatology. In the meantime, will check  sed rate, hypersensitive pneumonitis panel, angiotensin converting  enzyme, antineutrophil cytoplasmic antibody, Mycoplasma antibody. Low-dose solumedrol 40 mg IV q.8 hours will be reasonable at this time as well. Will be on FIO2 as tolerated. Will be off for the weekend. Dr. Tim Lair is covering over the weekend.   ____________________________ Clenton Pare Meredeth Ide, MD hef:es D: 12/14/2012 10:02:07 ET T: 12/14/2012 12:21:53 ET JOB#: [id number]  cc: Dunya Meiners E. Meredeth Ide, MD, <Dictator> Mertie Moores MD ELECTRONICALLY SIGNED 01/12/2013 13:26

## 2014-12-16 NOTE — Discharge Summary (Signed)
PATIENT NAME:  Natalie, Sweeney MR#:  [id number] DATE OF BIRTH:  [birth year]  DATE OF ADMISSION:  05/11/2013 DATE OF DISCHARGE:  05/19/2013  DISCHARGE DIAGNOSES:  1.  Acute respiratory failure secondary to right-sided pneumonia.  2.  Weakness.  3.  Hypertension.  4.  History of lupus erythematosus.  5.  Anxiety and depression. 6.  Vaginal yeast infection.   CHIEF COMPLAINT: Weakness and coughing.   HISTORY OF PRESENT ILLNESS: Natalie Sweeney is a 69 year old female with a history of lupus, hypertension and hyperlipidemia admitted with hypoxemic respiratory failure when she presented to the ED complaining of cough associated with weakness. The patient was noted to be hypoxemic with room air O2 sat of 87% that subsequently improved to 92%. Chest x-ray showed evidence of right upper lobe pneumonia. Subsequent CT of the chest also confirmed the presence of both right upper as well as lower lobe pneumonia.   PAST MEDICAL HISTORY: Significant for hypertension, hyperlipidemia, systemic lupus erythematosus, connective tissue disorder, rheumatoid arthritis, depression and history of breast cancer status post status left mastectomy. She has had a previous hysterectomy, mastectomy on the left side and pericardiocentesis in 1979.   PHYSICAL EXAMINATION: VITAL SIGNS: Temperature was 98.8, heart rate was 89, respirations 18, blood pressure 153/81, and O2 sat 92% on room air and dropped to 87% on ambulation.  GENERAL: She was not in distress.  HEENT: Pupils equal and reactive to light.  HEENT: Normocephalic, atraumatic.  NECK: Supple.  HEART: S1, S2.  LUNGS: Rhonchi in the right upper and mid lung fields.  ABDOMEN: Soft, nontender.  EXTREMITIES: No edema.  NEUROLOGIC: Alert and oriented x 3. No obvious focal signs.  LABORATORY AND DIAGNOSTICS: Alkaline phosphatase 142. Cardiac enzymes are negative. WBC count 16,000. UA showed 5 WBCs, 1 leukocyte esterase and trace bacteria.   Chest x-ray showed  evidence of right upper lobe pneumonia.   HOSPITAL COURSE: The patient was admitted to [facility] and started on intravenous antibiotics with IV Levaquin. Subsequently she lost IV access and she was switched to p.o. Ceftin and clindamycin. She was also started on IV Solu-Medrol and subsequently switched to p.o. prednisone and gradually taper down. She did have an elevated white cell count that went up as high as 20,000 which started to trend down afterwards. A CT of the chest showed evidence of right upper lobe and right lower lobe infiltrate, a small right pleural effusion. There was a peripherally calcified mass along the left heart border and there was evidence of left mastectomy. There was sclerosis of the inferior end plate of E45 that appeared to be unchanged and mild prominence of both renal collecting systems with cortical thinning and a possible nonobstructing upper pole right renal stone. Repeat chest x-ray showed significant improvement in her infiltrate. The patient also experienced some vaginal burning for which she was started on nystatin and also Diflucan. The patient gradually improved. She was seen by physical therapy and allowed home in stable condition on the following medications.   DISCHARGE MEDICATIONS: 1.  Seroquel 150 mg once a day. 2.  Gabapentin 300 mg b.i.d.  3.  Furosemide 20 mg in the morning. 4.  Methimazole 5 mg once a day. 5.  Duloxetine 60 mg at bedtime. 6.  Nexium 40 mg b.i.d.  7.  KCL  20 mEq once a day. 8.  Clindamycin 300 mg every 8 hours for 7 days. 9.  Cefuroxime 500 mg q. 12 for 7 days. 10.  Diltiazem 360 mg 1 capsule once a day. 11.  Hydroxychloroquine 20 mg once a day. 12.  Lorazepam 0.5 mg b.i.d.  13.  Alprazolam 0.25 mg every 8 hours as needed.  14.  Prednisone taper 20 mg a day for 3 days, decrease to 10 mg a day for 3 days and then drop down to 2.5 mg a day as before. 26.  Lactobacillus acidophilus 1 capsule b.i.d.   DISCHARGE FOLLOWUP: The patient was  advised to follow up with me, Dr. Marcello Fennel, in 1 to 2 weeks' time and call back if there are any questions or concerns. She was stable at the time of discharge. Advised to have a repeat CXR done in 4 weeks as a follow up.  TOTAL TIME SPENT IN DISCHARGE: 35 minutes.  ____________________________ Barbette Reichmann, MD vh:sb D: 05/20/2013 13:17:56 ET T: 05/20/2013 15:28:28 ET JOB#: [id number]  cc: Barbette Reichmann, MD, <Dictator> Barbette Reichmann MD ELECTRONICALLY SIGNED 05/31/2013 18:33

## 2014-12-16 NOTE — Discharge Summary (Signed)
PATIENT NAME:  Natalie Sweeney, Natalie Sweeney MR#:  [id number] DATE OF BIRTH:  08-11-1946  DATE OF ADMISSION:  07/23/2013 DATE OF DISCHARGE:  08/02/2013  DISCHARGE DIAGNOSES: 1. Acute respiratory failure.  2. Pneumonia. 3. Marked weakness from above.  4. Severe psychiatric disease on antipsychotics, antidepressants and stimulants per outpatient psychiatrist.  5. Acute renal failure improved with hydration.  6. Hypertension.  7. Candidal esophagitis and thrush.  DISCHARGE MEDICATIONS: Per [facility] med reconciliation and prescription for controls please see for details.   HISTORY AND PHYSICAL: Please see the detailed history and physical done on admission.   HOSPITAL COURSE: The patient was admitted by the hospitalist, usually a patient of Dr. Eston Esters but he was going to [place]. She was admitted short of breath, tight slowly improved with oxygen and inhaled bronchodilators, as well as antibiotics for pneumonia and IV corticosteroids. At this point she has stopped wheezing. I weaned her down to moderate dose of steroids, without difficulty with her respiratory status. She was very weak, had difficulty participating in physical therapy. She was up and ambulates some on her own with close supervision. She had some dysphagia and trouble swallowing, felt to have candidal esophagitis as well as thrush. That helped a good deal. She was eating better after that. No longer required IV hydration. Creatinine was down to approximately baseline 1.12 today with a GFR 58 calculated. She had a persistent leukocytosis, very mild at this point at 13.4, likely secondary to steroids alone. Some of this was thought to be due to her esophagitis. She was thought to need further physical therapy prior to going home. Chest x-ray showed improved over time re the infiltrate in the  right lung felt to be pneumonia improving. She had a calcified lesion in the left side that previously had been worked up and not felt to be pathologic or worrisome on  CT scans in the past. She agrees to and per social work notes there is a bed available at [facility]	[place] today, where she will be discharged.   TIME SPENT: Approximately 40 minutes to do all discharge tasks.   ____________________________ Marya Amsler. Dareen Piano, MD mwa:sg D: 08/02/2013 07:38:30 ET T: 08/02/2013 07:50:24 ET JOB#: [id number]  cc: Marya Amsler. Dareen Piano, MD, <Dictator> Lauro Regulus MD ELECTRONICALLY SIGNED 08/02/2013 8:19

## 2014-12-16 NOTE — Consult Note (Signed)
PATIENT NAME:  Natalie Sweeney, GEERDES MR#:  [id number] DATE OF BIRTH:  [birth year]  DATE OF CONSULTATION:  07/30/2013  CONSULTING PHYSICIAN:  Audery Amel, MD  IDENTIFYING INFORMATION AND REASON FOR CONSULTATION: A 69 year old woman currently in the hospital for a pneumonia. Consultation because she was thought to be more agitated.   HISTORY OF PRESENT ILLNESS: Information obtained from the patient, the chart, and discussion with the nursing staff. The consultation says that she has a history of depression and that she had recently been more agitated. The nursing staff tell me that what they notice is that the patient has been complaining of anxiety, but she has not been behaving inappropriately. On interview today, the patient tells me that her nerves are bad. She tells me that she has a lot of things on her mind. Particular concerns to her are that she thinks that she is afraid she will be discharged too quickly. She says she has been discharged too quickly in the past and that her pneumonia has come right back and she is very afraid of that happening. At the same time, she is afraid that she is going to be sent to [facility], which she does not like. She would prefer to go to [facility] or someplace else "closer to this side of town." She also complains that she does not think they are giving her her medicine correctly and does not think the doctors are listening to her as much as they should. She says she has not slept well in several days. She thinks that the steroids probably do affect her mood and make her jittery and make her more nervous and have more trouble sleeping. The patient denies any suicidal ideation. Denies any hallucinations. Denies any psychotic symptoms. Does not feel like she is terribly depressed, but does feel like she is more worried.   PAST PSYCHIATRIC HISTORY: No previous psychiatric hospitalizations that I can identify, and generally does not seem to be seen by psychiatry when  she is in the hospital. She has a long-standing history apparently of chronic depression and anxiety. She sees Dr. Omelia Blackwater in [place] for regular treatment. She thinks the medication that she takes normally at home is adequate. It sounds like that is Cymbalta 60 mg in the morning, Seroquel XR 150 mg at night, and Xanax 0.25 mg twice a day. She is prescribed to be up to 4 times a day, but she says that she normally only takes it twice a day. Also gets Ambien I think to help with sleep. Denies any history of suicidal behavior. Denies any history of psychosis.   SOCIAL HISTORY: The patient lives on her own, but says that her adult children are helpful for her and come by and help her when she is at home. She recognizes that when she is sick, it is difficult for her to live at home but thinks that she can make it with her children's help, but admits that there may be times when rehab can be helpful for her.   PAST MEDICAL HISTORY: The patient has chronic COPD, past history of breast cancer, history of depression,  lupus, hypertension, rheumatoid arthritis.   SUBSTANCE ABUSE HISTORY: None identified.   CURRENT MEDICATIONS: In the hospital, it looks like she is getting alprazolam which was 0.25 mg twice a day p.r.n., Zithromax 500 mg a day, cefepime 2 grams twice a day IV, diltiazem 360 mg extended-release a day, Cymbalta 60 mg at night, iron 325 mg a day, gabapentin 300  mg twice a day, Plaquenil 400 mg daily, lactulose daily, Solu-Medrol 30 mg twice a day IV, montelukast 10 mg at night, nystatin 10,000 units swish suspension, pantoprazole 40 mg in the morning, potassium chloride 20 mEq a day, Seroquel XR 150 mg at night, sucralfate 1 gram 4 times a day, Ambien 5 mg at night, aspirin 81 mg a day, Lovenox, albuterol nebulizer.   ALLERGIES: PENICILLIN AND SULFA DRUGS.   MENTAL STATUS EXAMINATION: Reasonably well-groomed given the situation woman, interviewed in her hospital room. She was awake and alert. She  was basically cooperative. At first she was slightly standoffish, but she warmed to her subject the more I listened to her. Eye contact was still decreased, psychomotor activity a little fidgety. Speech normal rate, tone, and volume. Affect slightly grumpy but not hostile. Mood stated as being nervous. Thoughts are lucid. No evidence of loosening of associations or delusions that I could identify. Denies suicidal or homicidal ideation. Denies hallucinations. Judgment and insight appear to be intact. Full cognitive testing not performed, but she is alert and oriented and seems to be expressing a reasonable understanding of her condition.   VITAL SIGNS: Most recent blood pressure 157/78, respirations 20, pulse 70, temperature 98.3.   ASSESSMENT: A 69 year old woman with chronic depression and anxiety. Very stressful situation with chronic medical problems, lots of medications, rehospitalization. She is worried about the future and where she is going to go and worried about the outcome. Clearly somewhat frightened by the pneumonia and her trouble breathing. Does not appear to be psychotic. Does not appear to be delirious. I think it was a relief to her just to have a chance to talk and ventilate at some length. I gave her some validation for that. Encouraged her to continue to try and present her concerns to the treatment team. I did make some minor changes to her medicine, which I think may help with her anxiety and to satisfy her. I increased the Seroquel XR to 200 mg at night, I increased her Ambien to 10 mg at night, both of these because her anxiety is worse and that she has not been sleeping, probably at least in part because of the medication as well as the hospitalization. I made her Xanax 3 times a day standing but still at 0.25. I changed her Cymbalta to morning, which she says is her normal regimen, and that was something that bothered her. I also changed her proton pump inhibitor to Nexium, which was  something else that she was complaining about.    DISPOSITION: Recommend followup with Dr. Omelia Blackwater after discharge.   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   SECONDARY DIAGNOSES: AXIS I: Depression, not otherwise specified.  AXIS II: Deferred.  AXIS III: Pneumonia, chronic obstructive pulmonary disease, hypertension, rheumatoid arthritis, lupus.  AXIS IV: Severe from illness and hospitalization.  AXIS V: Functioning at time of evaluation 50.     ____________________________ Audery Amel, MD jtc:jcm D: 07/30/2013 18:38:00 ET T: 07/30/2013 19:29:13 ET JOB#: [id number]  cc: Audery Amel, MD, <Dictator> Audery Amel MD ELECTRONICALLY SIGNED 08/01/2013 22:12

## 2014-12-16 NOTE — H&P (Signed)
PATIENT NAME:  Natalie Sweeney, Natalie Sweeney MR#:  [id number] DATE OF BIRTH:  1945-10-11  DATE OF ADMISSION:  05/11/2013  PRIMARY CARE PHYSICIAN:  Barbette Reichmann, MD  REQUESTING PHYSICIAN: Dr. Sharma Covert  CHIEF COMPLAINT: Weakness and coughing,   HISTORY OF PRESENT ILLNESS: The patient is a 69 year old female with a known history of lupus, hypertension and hyperlipidemia. He is being admitted for acute respiratory failure likely secondary to pneumonia. The patient was found on the floor next to her bed at her independent [facility] home and was having some back pain since last night. She was unable to stand up this a.m. and was found to be somewhat confused. The patient was feeling really weak and confused and EMS was called and she was brought down to the Emergency Department. While in the ED, she was found to be hypoxic with room air oxygen saturation of 87% on ambulation and at rest she was saturating 92%. She normally does not use any oxygen at home. In the ED, she also was found to have a right upper lobe pneumonia on chest x-ray. The patient has been complaining of productive cough over the last several days. Denies any fever.   PAST MEDICAL HISTORY: 1.  Hypertension.  2.  Hyperlipidemia.  3.  Systemic lupus erythematosus. 4.  Connective tissue disease. 5.  Rheumatoid arthritis.  5.  Depression.  6.  Breast cancer status post mastectomy on the left.   PAST SURGICAL HISTORY: 1.  Hysterectomy.  2.  Mastectomy on the left.  3.  Pericardiocentesis in 1979.   ALLERGIES: PENICILLIN AND SULFA.  FAMILY HISTORY: Positive for heart disease.   SOCIAL HISTORY: She is a former smoker. No alcohol or drug use. She lives at independent senior living.   HOME MEDICATIONS: 1.  Alprazolam 0.25 mg p.o. 4 times a day as needed.  2.  Cardizem CD 360 mg p.o. daily. 3.  Duloxetine 60 mg p.o. at bedtime.  4.  Lasix 20 mg p.o. daily as needed.  5.  Gabapentin 300 mg p.o. b.i.d.  6.  Methimazole 5 mg daily.  7.  Nexium  40 mg p.o. b.i.d. 8.  Potassium chloride 20 mEq p.o. daily.  9.  Prednisone 2.5 mg p.o. daily.  10.  Seroquel XR 150 mg p.o. at bedtime. 11.  Zolpidem 10 mg p.o. at bedtime as needed.   REVIEW OF SYSTEMS: CONSTITUTIONAL: No fever. Positive fatigue and weakness.  EYES: No blurred or double vision.  ENT: No tinnitus or ear pain.  RESPIRATORY: Positive for productive cough with whitish sputum. No wheezing. No hemoptysis. Positive for dyspnea.  CARDIOVASCULAR: No chest pain, orthopnea, edema.  GASTROINTESTINAL: No nausea, vomiting, diarrhea.  GENITOURINARY: No dysuria or hematuria.  ENDOCRINE: No polyuria or nocturia.  HEMATOLOGY: No anemia or easy bruising.  SKIN: No rash or lesions.  MUSCULOSKELETAL: No arthritis or muscle cramp. She does have difficulty walking.  NEUROLOGIC: No tingling, numbness. Positive for weakness, generalized.  PSYCHIATRIC: History of depression.   PHYSICAL EXAMINATION:  VITAL SIGNS: Temperature 98.8, heart rate 89 per minute, respirations 18 per minute, blood pressure 153/81 mmHg and she is saturating 92% on room air at rest and 87% on ambulation.  GENERAL: The patient is a 70 year old female lying in the bed comfortably without any acute distress.  EYES: Pupils equal, round and reactive to light and accommodation. No scleral icterus. Extraocular muscles intact.  HENT: Head atraumatic, normocephalic. Oropharynx and nasopharynx clear.  NECK: Supple. No jugular venous distention. No thyroid enlargement or tenderness.  LUNGS: Clear to  auscultation bilaterally. She does have minimal rhonchi at the right base and middle lobe. No wheezing or rales.  HEART: S1 and S2 normal. No murmurs, rubs or gallops.  ABDOMEN: Soft, nontender, nondistended. Bowel sounds present. No organomegaly or mass.  EXTREMITIES: No pedal edema, cyanosis or clubbing. SKIN: No obvious rash, lesion or ulcer.  MUSCULOSKELETAL: The patient did not have any obvious focal tenderness on the lumbar  spine area. NEUROLOGIC: Cranial nerves II through XII intact. Muscle strength 4/5 in all extremities. Sensation intact.  PSYCHIATRIC: The patient is alert and oriented x 3.   LABORATORY AND DIAGNOSTICS: Normal BMP, except potassium of 3.4. Normal liver function tests, except alkaline phosphatase of 142. Normal first set of cardiac enzymes. Normal CBC, except white count of 16. UA showed 5 WBCs, 1+ leukocyte esterase, trace bacteria.   Chest x-ray showed right upper lobe pneumonia.  EKG showed normal sinus rhythm, right bundle branch block and left anterior fascicular block. No major ST-T changes.   IMPRESSION AND PLAN: 1.  Acute hypoxic respiratory failure likely due to pneumonia. Will require admission. Will get 2 sets of blood cultures, obtain sputum culture and start her on Levaquin. 2.  Pneumonia. Antibiotics as above and monitor.  3.  Hypokalemia, repleted and recheck magnesium. 4.  Weakness, likely multifactorial with deconditioning. Will get physical and occupational therapy evaluation and management.  3.  Uncontrolled hypertension with  blood pressure in the ED of 180. Will resume her home blood pressure medication. Hopefully this should control it. If not will adjust medication as needed.   CODE STATUS: FULL CODE.   TOTAL TIME TAKING CARE OF THIS PATIENT: 55 minutes.  ____________________________ Ellamae Sia. Sherryll Burger, MD vss:sb D: 05/11/2013 16:09:28 ET T: 05/11/2013 17:14:05 ET JOB#: [id number]  cc: Terrick Allred S. Sherryll Burger, MD, <Dictator> Barbette Reichmann, MD Ellamae Sia [facility] MD ELECTRONICALLY SIGNED 05/12/2013 15:03

## 2014-12-16 NOTE — H&P (Signed)
PATIENT NAME:  Natalie Sweeney, Natalie Sweeney MR#:  [id number] DATE OF BIRTH:  [birth year]  REFERRING PHYSICIAN: Dr. Enedina Finner.   CHIEF COMPLAINT: Shortness of breath, cough, wheeze.   HISTORY OF PRESENT ILLNESS: The patient is a pleasant 69 year old African American female with a history of connective tissue disease and lupus, rheumatoid arthritis, who presents with the above chief complaints. The patient states that she started to feel unwell yesterday, with a productive cough which is greenish and yellow in color, and with shortness of breath. The patient also noticed she was wheezing. She describes a chronic wheeze, even when she is not ill. However she has been having increased wheezing than her baseline. She had some nausea and vomiting earlier today and presented to the hospital. She denied having any fevers, but stated she had some chills. Hospitalist service was contacted for further evaluation and management.   PAST MEDICAL HISTORY: Connective tissue disease, hypertension, hyperlipidemia, SLE,  rheumatoid arthritis, depression, history of breast cancer status post mastectomy on the left, history of hysterectomy, status post pericardiocentesis in 1979, per chart.   ALLERGIES: PENICILLIN and SULFA.   FAMILY HISTORY: Some siblings with cancer per chart. Parents with some heart issues.   SOCIAL HISTORY: Quit tobacco several years ago. No alcohol or drug use. Lives by herself.   REVIEW OF SYSTEMS:  CONSTITUTIONAL: No fever, but has some chills. No weight changes, but has some fatigue and weakness.  EYES: No blurry vision. Has some chronic decreased vision and cataracts.  ENT: No tinnitus or hearing loss.  RESPIRATORY: Positive for cough. No hemoptysis. Sputum production, as above, some wheezing and some shortness of breath.  CARDIOVASCULAR: No chest pain, but has some chest pain when she coughs.  GASTROINTESTINAL: Nausea and vomiting. No diarrhea or constipation.  GENITOURINARY: Denies dysuria, hematuria.   HEMATOLOGIC/LYMPHATIC: No anemia or easy bruising.  SKIN: No new rashes.   MUSCULOSKELETAL: Has rheumatoid arthritis and chronic shoulder pains. NEUROLOGIC: No syncope or loss of consciousness. No seizures.  PSYCHIATRIC: No anxiety or insomnia.   OUTPATIENT MEDICATIONS: Atorvastatin 10 mg daily, calcitonin 200 international units,  1 spray alternate nostrils once a day, Cymbalta 60 mg daily, diltiazem extended-release 260 mg daily, Flonase 50 mcg, 2 sprays nasally once a day, Lasix 20 mg in the morning as needed, gabapentin 300 mg 2 times a day, hydroxychloroquine 400 mg daily, Klor-Con 20 mEq daily at bedtime, metaxalone 800 mg, take one-half tablet every 12 hours as needed for muscle spasms, Nexium 20 mg 2 times a day, prednisone 2.5 mg daily, ProAir HFA p.r.n., Seroquel 150 mg extended-release daily, Xanax 0.5 mg every 8 hours, zolpidem 10 mg at bedtime as needed.   PHYSICAL EXAMINATION: VITAL SIGNS: Temperature on arrival 97.5. Initial pulse rate was 94; when I was in the room it was 109. Last temperature of 101.9. Initial respiratory rate 22. Initial blood pressure 134/59. Initial O2 sat was 90% on room air, but when I was in the room it was 85% when talking on oxygen.  GENERAL: The patient is a well-developed [place] American female lying in bed, somewhat hard of hearing.  HEENT: Normocephalic, atraumatic. Pupils are equal and reactive. Anicteric sclerae. Extraocular muscles intact. Dry mucous membranes.  NECK: Supple. No thyroid tenderness. No cervical lymphadenopathy.  CARDIOVASCULAR: S1, S2, tachycardic. No murmurs, rubs, or gallops.  LUNGS: There is increased wheezing and some rhonchi and crackles, bilateral bases, right greatly more so than the left. Also some fine crackles, bilateral lungs.  ABDOMEN: Soft, nontender, nondistended. Positive bowel sounds in all  quadrants.  EXTREMITIES: No significant lower extremity edema.  SKIN: No obvious rashes.  NEUROLOGIC: Cranial nerves II-XII  grossly intact. Strength is 5/5 in all extremities. Sensation intact to light touch.  PSYCHIATRIC: Awake, alert, oriented x 3. Cooperative.   LABS: BNP 820, BUN 19, creatinine 1.1, sodium 144, potassium 2.9, chloride 110, magnesium 1.4, albumin 2.7. Otherwise LFTs within normal limits.   Serum glucose initially was 57. Troponin negative. WBC 14.6, hemoglobin 10.9.   UA not suggestive of infection.   X-ray of the chest: Diffuse right-sided infiltrate concerning for pneumonitis, underlying component of her pulmonary edema is also a diagnostic consideration. Stable calcified density of left hilar region, likely representing stable calcified lymph node.   EKG: Rate is 97, sinus rhythm, right bundle branch. No acute ST elevations or depression.   T wave inversions, V1 to V3 which appears to be old.   ASSESSMENT AND PLAN: We have a pleasant 69 year old African American female with connective tissue disease, rheumatoid arthritis, as well as systemic lupus erythematosus who comes in with hypoxic respiratory failure, systemic inflammatory response syndrome criteria, with probable pneumonia, nausea, vomiting and some weakness. The patient appears to have significant wheezing on exam as well as  rales and scattered crackles. The patient was, of note, hospitalized here last year for bilateral pneumonia. Of note, previous CAT scan last year had shown possible aspiration component. At this point I have admitted the patient to the hospital. There is respiratory failure; this could be secondary to underlying pneumonia, however given the history of connective tissue disease and systemic lupus erythematosus and rheumatoid arthritis, a pulmonary fibrosis or other rheumatoid lung involvement is also a possibility, in addition to possible aspiration as it was last year. However, that has as yet to be determined. At this point would start the patient on Levaquin given a history of ALLERGY TO PENICILLIN but the patient was  given a dose of ceftriaxone and appears have done okay. Furthermore, we would obtain a CT of the chest for better visualization, start the patient on oxygen, get sputum cultures, urine strep and Legionella antigens, and then nebulizers around-the-clock. Would also obtain a speech and swallow evaluation. The patient appears to have tachycardia, fever, leukocytosis and possible pneumonia, therefore likely has clinical sepsis. We would start the patient on some IV fluids, oxygen, treat the lungs and follow with the cultures. In regards to the systemic lupus erythematosus and connective tissue disease, would resume outpatient medications. Would consider ID consult if no significant improvement in the next day or two as the patient is on immunosuppressive therapy. Would continue patient's depression and anxiety medications. She appears to have hypokalemia, and would replete and recheck the potassium later today. She does appear to have some chronic renal failure, stage III per evaluation of previous labs, which appears to be stable. Would also replete the magnesium which is low, and start the patient on heparin for DVT prophylaxis.   THE PATIENT IS FULL CODE.   Total time spent: 60 minutes.    ____________________________ Krystal Eaton, MD sa:dm D: 12/09/2012 12:21:42 ET T: 12/09/2012 13:21:07 ET JOB#: [id number]  cc: Krystal Eaton, MD, <Dictator> Rosalyn Gess. Blocker, MD Krystal Eaton MD ELECTRONICALLY SIGNED 12/19/2012 13:58

## 2014-12-16 NOTE — H&P (Signed)
PATIENT NAME:  Natalie Sweeney, Natalie Sweeney MR#:  [id number] DATE OF BIRTH:  02-17-1946  DATE OF ADMISSION:  07/23/2013  PRIMARY CARE PHYSICIAN: Barbette Reichmann, MD  CHIEF COMPLAINT: Shortness of breath and fever along with wheezing for 1 to 2 days.   HISTORY OF PRESENT ILLNESS: Natalie Sweeney is a pleasant 69 year old African American female who has had pneumonias in the past, 3 pneumonias this year, who was discharged on 09/24 with right upper lobe pneumonia, and comes into the Emergency Room after she started feeling sick, wheezing and with dry cough. She had a fever of 100.3. In the Emergency Room, the patient was found to be very hypoxic with sats down in the 70s on room air. She was started on nasal cannula oxygen, about 2 liters, and her sats came up to 97%. She was also found to have a large right lower lobe pneumonia. She is being admitted for sepsis secondary to right lower lobe pneumonia.   PAST MEDICAL HISTORY: 1.  Rheumatoid arthritis. The patient follows up with Dr. Lavenia Atlas.  2.  Hypertension.  3.  Hyperlipidemia.  4.  Lupus. 5.  Depression.  6.  Breast cancer status post mastectomy on the left.  7.  The patient is a former smoker for more than 20 years. There is no official diagnosis of COPD.CXR is suggestive of COPD and she does follow up with Dr. Meredeth Ide for her respiratory issues, per patient.   PAST SURGICAL HISTORY: 1.  Hysterectomy.  2.  Mastectomy on the left. 3.  Pericardiocentesis in 1979.  ALLERGIES: PENICILLIN AND SULFA.   FAMILY HISTORY: Positive for heart disease.  SOCIAL HISTORY: She is a former smoker, nonalcoholic, and does not use drugs. [facility] lives in [facility] living.  HOME MEDICATIONS: 1.  Tramadol 50 mg 1 tablet every 6 hours as needed.  2.  Sucralfate 1 gram 4 times a day.  3.  Skelaxin 800 mg 1 tablet b.i.d.  4.  Seroquel XR 150 mg p.o. daily at bedtime.  5.  Risperidone 0.5 mg b.i.d.  6.  Promethazine 25 mg 1 tablet every 6 hours as needed.  7.   K-Dur 20 mEq p.o. daily.  8.  Nuvigil 150 mg p.o. daily.  9.  Lipitor 10 mg p.o. daily at bedtime.  10.  Lactulose 30 mL p.o. daily.  11.  Hydroxychloroquine 200 mg 2 tablets once a day.  12.  Gabapentin 300 mg b.i.d.  13.  Lasix 20 mg daily.  14.  Ferrous sulfate 2 tablets daily.  15.  Diltiazem 360 mg extended release p.o. daily.  16.  Dexilant 60 mg p.o. daily.  17.  Cymbalta 60 mg p.o. daily at bedtime.  18.  Aspirin 81 mg daily.  19.  Alprazolam 0.25 mg b.i.d. as needed for anxiety.   REVIEW OF SYSTEMS: CONSTITUTIONAL: Positive for fever, fatigue, weakness.  EYES: No blurred or double vision, glaucoma or cataracts.  ENT: No tinnitus, ear pain, hearing loss or sinusitis.  RESPIRATORY: Positive for cough, wheeze, pleuritic chest pain and shortness of breath.  CARDIOVASCULAR: Pleuritic chest pain on the right along with dyspnea on exertion and hypertension. No palpitations or arrhythmias.  GASTROINTESTINAL: No nausea, vomiting, diarrhea, abdominal pain, GERD or melena.  GENITOURINARY: No dysuria, hematuria, frequency or incontinence.  ENDOCRINE: No polyuria, nocturia or thyroid problems.  HEMATOLOGY: No anemia or easy bruising or skin lesion.  MUSCULOSKELETAL: Positive for arthritis and back pain.  NEUROLOGIC: No CVA, TIA, dysarthria, tremors or vertigo.  PSYCH: The patient has anxiety. No  depression or bipolar disorder. All other systems reviewed and negative.   PHYSICAL EXAMINATION: GENERAL: The patient is awake, alert, and oriented x3. VITAL SIGNS: Temperature 100.3, pulse 88, blood pressure 129/65, and her sats were 78% on room air, improved to 99 to 100% on 2 liters.  HEENT: Atraumatic, normocephalic. Pupils are equal, round and reactive to light and accommodation. EOM intact. Oral mucosa is dry.  NECK: Supple. No JVD. No carotid bruit.  RESPIRATORY: Decreased breath sounds in the bases, more on the right than the left. No respiratory distress or use of accessory muscles. Some  mild wheezing and coarse crackles heard on the right.  CARDIOVASCULAR: Both the heart sounds are normal. No murmur heard. PMI not lateralized. Chest is nontender.  EXTREMITIES: Good pedal pulses. Good femoral pulses. No lower extremity edema.  ABDOMEN: Soft, benign, nontender. No organomegaly. Positive bowel sounds.  NEUROLOGIC: Grossly intact cranial nerves II through XII. No motor or sensory deficit.  PSYCHIATRIC: The patient is awake, alert, and oriented x3. She is somewhat anxious.  SKIN: Warm and dry.   LABORATORY AND DIAGNOSTICS: Chest x-ray: Pneumonia in the right lower lobe, possibly portions of the inferior aspect of the right upper lobe.  BNP is 586. White count is 14.4, H and H are 12.3 and 37.3, and platelet count is 279. Potassium is 2.9. BUN and creatinine are 1.23 and 23. Glucose is 82, SGOT 50, SGPT 26, total protein is 6.2, and albumin is 2.9. Troponin is 0.02.   EKG showed normal sinus rhythm, left anterior fascicular block, right bundle branch block, LVH with QRS.  ASSESSMENT AND PLAN: 69 year old [name] with history of rheumatoid arthritis, hypertension, recurrent pneumonias, presumed diagnosis of chronic obstructive pulmonary disease who comes in with:  1.  Sepsis secondary to right lower lobe pneumonia. The patient is found to have temperature of 100.2, was hypoxic with 78% oxygen on room air along with leukocytosis and chest x-ray positive for pneumonia. She will be admitted to the telemetry floor. The patient comes in within 30 days of last hospitalization, that was in September of 2014, and could be healthcare-associated pneumonia. We will cover her with broad-spectrum antibiotics with IV vancomycin, cefepime, and Levaquin. The case was discussed with Dr. Belia Heman who will see the patient in consultation. We will give some around the clock IV Solu-Medrol and DuoNebs and follow up blood cultures and urine culture. We will have respiratory induce to have sputum given her recurrent  pneumonias.  2.  Acute hypoxic respiratory failure suspected underlying chronic obstructive pulmonary disease. The patient however does not have a formal diagnosis. The patient sees Dr. Meredeth Ide as outpatient. She is a former ex-smoker for 20 plus years. Will place the patient on Solu-Medrol around the clock along with nebulizers and inhalers. We will have Dr. Belia Heman see the patient.  3.  Anxiety. Continue alprazolam.  4.  Hypertension. We will continue diltiazem.  5.  Hypokalemia. Replace p.o. and IV potassium.  6.  Deep vein thrombosis prophylaxis. Subcutaneous Lovenox.   Further workup according to the patient's clinical course. Hospital admission plan was discussed with the patient. No family members were present.   CRITICAL TIME SPENT: 60 minutes. ____________________________ Wylie Hail Allena Katz, MD sap:sb D: 07/23/2013 13:25:45 ET T: 07/23/2013 13:58:29 ET JOB#: [id number]  cc: Rojean Ige A. Allena Katz, MD, <Dictator> Barbette Reichmann, MD Willow Ora MD ELECTRONICALLY SIGNED 08/12/2013 10:55

## 2014-12-17 NOTE — H&P (Signed)
PATIENT NAME:  Natalie, KREISCHER MR#:  [id number] DATE OF BIRTH:  06-14-1946  DATE OF ADMISSION:  03/01/2014  PRIMARY CARE PHYSICIAN:  Barbette Reichmann, MD  REQUESTING PHYSICIAN:  Mosetta Pigeon, MD  CHIEF COMPLAINT:  Right flank pain.   HISTORY OF PRESENT ILLNESS: The patient is a 69 year old female with a known history of chronic kidney disease stage III, rheumatoid arthritis, connective tissue disease and lupus along with recent resistant enterococcal urinary tract infection. She is being directly admitted by request of Dr. Mosetta Pigeon for possible pyelonephritis and resistant enterococcal urinary tract infection. The patient was seen in Dr. Doristine Church office today. She has been failing 3 different antibiotic courses, continued to complain of right flank pain for about a month now and Dr. Thedore Mins has been trying to coordinate her care as an outpatient with help of home health services but had not been able to do so and she is being admitted for further evaluation and management for treatment of her pyelonephritis and/or resistant urinary tract infection.   PAST MEDICAL HISTORY: 1.  Lupus.  2.  Connective tissue disease.  3.  Rheumatoid arthritis.  4.  Enterococcal urinary tract infection.  5.  Reflux esophagitis.   SOCIAL HISTORY: Former smoker of 30 pack years, quit in 2011.   ALLERGIES: PENICILLIN AND SULFA.    FAMILY HISTORY: Positive for coronary artery disease.   REVIEW OF SYSTEMS:    CONSTITUTIONAL: No fever, fatigue, weakness.  EYES: No blurred or double vision.  ENT: No tinnitus, ear pain.  RESPIRATORY: No cough, wheezing, hemoptysis.  CARDIOVASCULAR: No chest pain, orthopnea, edema.  GASTROINTESTINAL: No nausea, vomiting, diarrhea.  GENITOURINARY:  No dysuria or hematuria. ENDOCRINE: No polyuria or nocturia. HEMATOLOGY:  No anemia or easy bruising.  SKIN: No rash or lesion.  MUSCULOSKELETAL: She has right flank pain.  NEUROLOGIC: No tingling, numbness, weakness.   PSYCHIATRIC: No history of anxiety or depression.   MEDICATIONS AT HOME: 1.  Advair 250/50 one puff b.i.d. as needed.  2.  Alprazolam 0.25 mg p.o. b.i.d.  3.  Aspirin 81 mg p.o. daily.  4.  Atorvastatin 10 mg p.o. at bedtime.  5.  Bumetanide 2 mg p.o. daily.  6.  Calcitonin once daily.  7.  Calcium carbonate 1 tablet p.o. daily.  8.  Colace 1 capsule p.o. daily.  9.  Lactobacillus once daily. 10.  Cymbalta 60 mg p.o. daily.  11.  Dexilant 60 mg p.o. b.i.d.  12.  Diltiazem HCl 360 mg p.o. daily.  13.  Ferrous sulfate 140 mg p.o. t.i.d.  14.  Flonase 2 sprays intranasally daily.  15.  Gabapentin 300 mg 2 capsules p.o. daily.  16.  Hydroxychloroquine 200 mg 2 tablets p.o. daily.  17.  Klor-Con 20 mEq p.o. daily.  18.  Lactulose as needed.  19.  Nexium 20 mg p.o. daily.  20.  Nuvigil 250 mg p.o. daily.  21.  Omega-3 fatty acid once daily.  22.  Prednisone 2.5 mg p.o. daily.  23.  ProAir 2 puffs inhaled as needed.  24.  Promethazine 25 mg p.o. b.i.d. as needed.  25.  Risperidone 0.5 mg p.o. every 12 hours.  26.  Seroquel 150 mg p.o. at bedtime.  27.  Sucralfate 1 gram p.o. 4 times a day.  28.  Thiamine 50 mg p.o. daily.  29.  Tramadol 50 mg p.o. 3 times a day.  30.  Zolpidem 10 mg p.o. daily.   PHYSICAL EXAMINATION: VITAL SIGNS: Temperature 98.8, heart rate 67 per minute, respirations 16  per minute, blood pressure 128/72 mmHg she is saturating 94% on room air.  GENERAL: The patient is a 69 year old female lying in the bed comfortably without any acute distress.  EYES: Pupils equal, round and reactive to light and accommodation. No scleral icterus. Extraocular muscles intact.  HEENT: Head atraumatic, normocephalic. Oropharynx and nasopharynx clear.  NECK: Supple. No jugular venous distention. No thyroid enlargement or tenderness.  LUNGS: Clear to auscultation bilaterally. No wheezes or rales or rhonchi.   CARDIOVASCULAR: S1, S2 normal. She does have prominent systolic ejection  murmur heard over the precordium.  ABDOMEN: Soft, nontender, nondistended. Bowel sounds present. She does have right flank tenderness.  EXTREMITIES: She has 2+ pedal edema.  No cyanosis or clubbing.  NEUROLOGIC: Cranial nerves II through XII intact. muscle strength 5 out of 5 in all extremities. Sensation intact.  PSYCHIATRIC: Alert and oriented x 3.  SKIN: No obvious rash, lesion or ulcer.  MUSCULOSKELETAL: Severe ulnar deviation of the fingers.   LABORATORY, DIAGNOSTIC AND RADIOLOGICAL DATA:  Still pending.   IMPRESSION AND PLAN: 1.  Pyelonephritis. We will start on IV ertapenem based on her urine culture and sensitivity from Dr. Doristine Church office where she was resistant to most of the antibiotics. She was growing Escherichia coli which was pan resistant except sensitivity to cefepime, ceftriaxone, ertapenem,  imipenem and nitrofurantoin although she is allergic to penicillin and sulfa so ertapenem would be a good choice at this time. Will consult infectious disease and consult nephrology at this time for further management.  2.  End-stage renal disease, on hemodialysis. We will consult nephrology for dialysis need.  3.  Hypertension. We will continue home medication.  4.  Chronic obstructive pulmonary disease. Continue inhalers. This seems stable.  5.  CODE STATUS: Full code.   TOTAL TIME TAKEN FOR THIS PATIENT: 55 minutes.    ____________________________ Ellamae Sia. Sherryll Burger, MD vss:cs D: 03/01/2014 15:02:25 ET T: 03/01/2014 16:08:10 ET JOB#: [id number]  cc: Darriel Sinquefield S. Sherryll Burger, MD, <Dictator> Mosetta Pigeon, MD Saverio Danker Barbette Reichmann, MD  Ellamae Sia [facility] MD ELECTRONICALLY SIGNED 03/02/2014 13:12

## 2014-12-17 NOTE — Discharge Summary (Signed)
PATIENT NAME:  Natalie Sweeney, LOONEY MR#:  [id number] DATE OF BIRTH:  1945-12-22  DATE OF ADMISSION:  03/01/2014 DATE OF DISCHARGE:  03/04/2014  DIAGNOSES AT TIME OF DISCHARGE:  1. 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  2. History of lupus.  3. History of rheumatoid arthritis, acid reflux, anxiety, hypertension, chronic obstructive pulmonary disease.   CHIEF COMPLAINT: Right flank pain.   HISTORY OF PRESENT ILLNESS: The patient is a 69 year old female with history of rheumatoid arthritis, lupus, who was recently diagnosed with multidrug-resistant Escherichia coli urinary tract infection and was advised by Dr. Thedore Mins, nephrologist, to be admitted for IV antibiotics. On June 23rd urine sample showed 3+ leukocyte esterase and white cells greater than 30, clumps of WBCs. Culture at that time grew Escherichia coli greater than 100,000 sensitive to cefepime, ceftriaxone, ertapenem, imipenem, nitrofurantoin. The patient has been complaining of pain in the right flank associated with increased frequency of urination and nausea.   PAST MEDICAL HISTORY: Significant for lupus, connective-tissue disease, rheumatoid arthritis, reflux esophagitis, hyperlipidemia, gastroesophageal reflux disease, anxiety, and COPD. Please see H and P for other details.   HOSPITAL COURSE: The patient was admitted to [facility] and initially started on ertapenem, but she has been complaining of nausea which she attributed to the ertapenem. She was subsequently seen in consultation by ID specialist, Dr. Sampson Goon, and was switched to IV Rocephin. The patient responded well to this antibiotic. Her renal function also improved with IV fluids. However, her creatinine did go up from 1.14 to 1.61. Her potassium was low, and she was given potassium supplements. The patient symptomatically improved. She did have some right posterior chest wall-type pain for which she was given hydrocodone.   DISCHARGE MEDICATIONS: She was  discharged in stable condition on the following medication: Seroquel XR 150 mg at bedtime, gabapentin 300 mg p.o. b.i.d., diltiazem 360 one capsule once a day, Lipitor 10 mg a day, aspirin 81 mg a day, alprazolam 0.25 mg b.i.d. p.r.n., Advair Diskus 1 puff b.i.d., senna 1 tablet twice a day as needed, nebulized bronchodilator therapy with albuterol and ipratropium every 4 to 6 hours as needed, hydroxychloroquine 200 mg p.o. b.i.d., zolpidem 10 mg at bedtime, ferrous sulfate 325 mg once a day, Cymbalta 60 mg once a day, nystatin 5 mL every 6 hours, Risperdal 0.5 mg p.o. b.i.d., promethazine 25 mg q. 2 hours as needed, Nuvigil 250 mg once a day, metaxalone 800 mg once a day, furosemide 20 mg once a day, hydrocodone 5/325 q. 12 p.r.n. for 10 days, cefpodoxime 100 mg b.i.d. for 7 more days, and potassium 20 mEq once a day for 1 week.   FOLLOWUP: She has been advised to follow up with Dr. Thedore Mins and also follow up with me, Dr. Marcello Fennel, in 1-2 weeks' time. Advised to call back with any questions or concerns. She has been advised to have a CBC and metabolic panel done in 1 week's time. She has been encouraged to drink plenty of fluids and call back with any questions or concerns.   TOTAL TIME SPENT IN DISCHARGING THIS PATIENT: 35 minutes.     ____________________________ Barbette Reichmann, MD vh:lt/am D: 03/04/2014 15:30:36 ET T: 03/05/2014 02:38:03 ET JOB#: [id number]  cc: Barbette Reichmann, MD, <Dictator> Barbette Reichmann MD ELECTRONICALLY SIGNED 03/21/2014 18:14

## 2014-12-17 NOTE — Discharge Summary (Signed)
PATIENT NAME:  Natalie Sweeney, Natalie Sweeney MR#:  [id number] DATE OF BIRTH:  06-19-1946  DATE OF ADMISSION:  12/18/2013 DATE OF DISCHARGE:  12/23/2013  DIAGNOSES AT TIME OF DISCHARGE: 1.  Sepsis with right-sided pneumonia.  2.  Chronic obstructive pulmonary disease exacerbation.  3.  Hypertension.  4.  History of rheumatoid arthritis.  5.  History of hypertension.  6.  History of gastroesophageal reflux disease.   CHIEF COMPLAINT:  Weakness, cough with productive sputum, fatigue, shortness of breath.   HISTORY OF PRESENT ILLNESS:  Natalie Sweeney is a 69 year old African American female with a history of rheumatoid arthritis and lupus as well as COPD presented to the ED complaining of cough and productive sputum, dyspnea on exertion, fevers and chills associated with sense of fatigue, generalized weakness.  The patient was noted to be febrile and hypoxemic on arrival to the ED.   PHYSICAL EXAMINATION: VITAL SIGNS:  Temperature was 101.7, heart rate was 80, respirations 20, blood pressure 166/75, O2 sat initially 86% on room air, that improved to 96% on supplemental oxygen.  HEENT:  NCAT.  LUNGS:  Good respiratory effort, but diffuse coarse breath sounds with crackles in the right lung field.  HEART:  S1, S2.  ABDOMEN:  Soft, nontender.  EXTREMITIES:  No edema.  NEUROLOGIC:  Alert and oriented.  She was hard of hearing.  No obvious focal signs.   HOSPITAL COURSE:  The patient was admitted to [facility].  Initial lab work showed WBC count of 22.8, hemoglobin 11.9, platelet count 205, creatinine 1.33, sodium 142, potassium 3.3, chloride 106, CO2 28, EGFR of 47.  Chest x-ray showed worsening diffuse air space opacification involving mainly much of the right lung.  There was a calcified mass noted on the left hilum which appeared to be stable from prior studies.  Cardiomegaly was also noted.  The patient was admitted to [facility] and started on IV antibiotics.  Her white cell count gradually improved.  She was also  started on IV Solu-Medrol because of shortness of breath and this was subsequently switched to by mouth prednisone and repeat chest x-ray showed evidence of right lower lobe pneumonia and upper lobe airway disease.  Clinically the patient did improve.  Her shortness of breath also improved and she was allowed home in stable condition.  She was also seen by physical therapy and home health nursing was arranged for her.   DISCHARGE MEDICATIONS:  She was discharged on the following medications.  Cefuroxime 250 mg q. 12 for 10 days.  Levaquin 500 mg daily for 7 to 10 days.  Prednisone taper 40 mg daily for three days and decrease by 10 mg every three days until gone.  Hydroxychloroquine 200 mg twice daily.  Seroquel XR 150 mg once a day, gabapentin 300 mg twice daily, diltiazem 360 1 capsule once a day, Lipitor 10 mg once a day, aspirin 81 mg a day, alprazolam 0.25 mg twice daily as needed, lactulose 10 grams 30 mL once a day as needed, sucralfate 1 gram 4 times a day, acetaminophen 2 tabs every four hours as needed, fluticasone Solu-Medrol 250/50 1 puff twice daily, nebulized albuterol and Atrovent q. 4 as needed, metaxalone 800 mg 0.5 tablets twice daily, KCl 20 mEq once a day.  Zolpidem 10 mg once a day at bedtime, methimazole 10 mg once a day, Lasix 40 mg once a day, Nexium 40 mg twice daily, ferrous sulfate 325 mg once a day, tramadol 37.5 mg twice daily as needed.  Cymbalta 60 mg once  a day, hydroxychloroquine twice daily 200 mg.    The patient is advised physical and occupational therapy and advised her that she will have a repeat chest x-ray when she comes back to the clinic and advised to follow with me, Dr. Ginette Pitman, and also follow with Dr. Raul Del in 1 to 2 weeks' time.   Total time spent in discharging the patient 35 minutes.    ____________________________ Tracie Harrier, MD vh:ea D: 12/30/2013 14:02:41 ET T: 12/31/2013 00:42:50 ET JOB#: [id number]  cc: Tracie Harrier, MD,  <Dictator> Tracie Harrier MD ELECTRONICALLY SIGNED 01/11/2014 13:25

## 2014-12-17 NOTE — H&P (Signed)
PATIENT NAME:  REI, CONTEE MR#:  [id number] DATE OF BIRTH:  1946-08-05  DATE OF ADMISSION:  12/18/2013  REFERRING PHYSICIAN:  Dr. Ethelda Chick.   PRIMARY CARE PHYSICIAN:  Dr. Marcello Fennel at [facility].   CHIEF COMPLAINT:  Weakness.   HISTORY OF PRESENT ILLNESS:  A 69 year old African American female with a history of rheumatoid arthritis and lupus as well as COPD presenting with weakness, describes three day duration of generalized weakness, fatigue with associated cough productive of whitish to yellow sputum with associated shortness of breath, described mainly as dyspnea on exertion as well as subjective fevers and chills.  She denies any chest pain, palpitations or further symptomatology.  On arrival to the Emergency Department, she was noted to be febrile as well as hypoxemic, requiring supplemental O2 to keep oxygen saturation greater than 92%, complaining only of weakness currently.   REVIEW OF SYSTEMS:  CONSTITUTIONAL:  Positive for fevers, chills, weakness, fatigue.  EYES:  Denies blurred vision, double vision, eye pain.  EARS, NOSE, THROAT:  Denies tinnitus, ear pain, hearing loss.  RESPIRATORY:  Positive for cough, shortness of breath.  CARDIOVASCULAR:  Denies chest pain, palpitations, edema.  GASTROINTESTINAL:  Denies nausea, vomiting, diarrhea, abdominal pain.  GENITOURINARY:  Denies dysuria or hematuria.  ENDOCRINE:  Denies nocturia or thyroid problems.  HEMATOLOGY AND LYMPHATIC:  Denies easy bruising or bleeding.  SKIN:  Denies rashes or lesions.  MUSCULOSKELETAL:  Positive for pain in shoulders, knees, hands, chronic arthritic symptoms.  NEUROLOGIC:  Denies paralysis, paresthesias.  PSYCHIATRIC:  Denies anxiety or depressive symptoms.  Otherwise, full review of systems performed by me is negative.   PAST MEDICAL HISTORY:  Rheumatoid arthritis, hypertension, lupus, COPD, history of breast cancer, gastroesophageal reflux disease.   SOCIAL HISTORY:  Remote tobacco abuse.   Denies any alcohol or drug usage.   FAMILY HISTORY:  Positive for coronary artery disease.   ALLERGIES:  PENICILLIN AND SULFA DRUGS.   HOME MEDICATIONS:  Acetaminophen 325 2 tablets every four hours as needed for pain or fever, aspirin 81 mg by mouth daily, diltiazem 360 mg by mouth daily, gabapentin 300 mg by mouth twice daily, Cymbalta 60 mg by mouth at bedtime, duloxetine 60 mg by mouth daily, Lipitor 10 mg by mouth at bedtime, Plaquenil 200 mg 2 tablets by mouth daily, Seroquel 150 mg by mouth daily, alprazolam 0.25 mg by mouth twice daily as needed for anxiety, Ambien 10 mg by mouth at bedtime as needed for sleep, Advair 250/50 mcg inhalation 1 puff twice daily, DuoNeb treatments every four hours as needed for wheezing, Lasix 40 mg by mouth daily, Feosol 325 mg by mouth daily, sucralfate 1 gram by mouth 4 times daily with meals and at nighttime, Nexium 40 mg by mouth twice daily.   PHYSICAL EXAMINATION: VITAL SIGNS:  Temperature 101.7, heart rate 80, respirations 20, blood pressure 166/75, saturating 86% on room air, saturating 96% on supplemental O2.  Weight 73.5 kg, BMI 27.4.  GENERAL:  Well-nourished, well-developed [place] American female, currently in no acute distress.  HEAD:  Normocephalic, atraumatic.  EYES:  Pupils equal, round, reactive to light.  Extraocular muscles intact.  No scleral icterus.  MOUTH:  Moist mucosal membrane.  Dentition intact.  No abscess noted.  EAR, NOSE, THROAT:  Throat clear without exudates.  No external lesions.  NECK:  Supple.  No thyromegaly.  No nodules.  No JVD.  PULMONARY:  Diffuse coarse breath sounds throughout the entire right lung field.  No use of accessory muscles.  Good respiratory  effort.  CHEST:  Nontender to palpation.  CARDIOVASCULAR:  S1, S2, regular rate and rhythm.  No murmurs, rubs or gallops.  No edema.  Pedal pulses 2+ bilaterally.  GASTROINTESTINAL:  Soft, nontender, nondistended.  No masses.  Positive bowel sounds.  No  hepatosplenomegaly.  MUSCULOSKELETAL:  No swelling, clubbing, or edema.  Range of motion full in all extremities.  NEUROLOGICAL:  Cranial nerves II through 12 intact.  No gross focal neurological deficits.  Sensation intact.  Reflexes intact.  SKIN:  No ulcerations, lesions, rash, or cyanosis.  Skin warm, dry.  Turgor intact.  PSYCHIATRIC:  Mood and affect tearful.  Awake, alert, oriented x 3.  Insight and judgment intact.   LABORATORY DATA:  Chest x-ray performed, diffuse airspace opacification involving the majority of the right lung, which has progressed since prior films as well as a 4.3 cm calcified mass in the left hilum which appears stable.  Remainder of laboratory data:  Sodium 142, potassium 3.3, chloride 106, bicarb 28, BUN 13, creatinine 1.33, glucose 92.  Troponin less than 0.02.  WBC 22.8, hemoglobin 11.9, platelets 205.  Urinalysis negative for evidence of infection.   ASSESSMENT AND PLAN:  A 69 year old [place] American female with history of rheumatoid arthritis, lupus, hypertension, chronic obstructive pulmonary disease, presenting with weakness, fevers, chills.  1.  Sepsis:  Meeting septic criteria by leukocytosis and temperature secondary to pulmonary source, community-acquired pneumonia, panculture including blood and sputum.  Also check Streptococcus and Legionella urinary antigens.  Antibiotic coverage with vancomycin and cefepime for broad-spectrum antibiotic coverage.  Follow culture data.  We will decrease antibiotics when able.  DuoNeb therapy q. 4 hours, supplemental oxygen to keep oxygen saturation greater than 92%.  Intravenous fluid hydration to keep mean arterial pressure greater than 65.  Incentive spirometry.  2.  Hypertension.  Continue with Cardizem.  3.  Gastroesophageal reflux disease.  Proton pump inhibitor therapy.  4.  Chronic obstructive pulmonary disease.  Advair.  5.  Venous thromboembolus prophylaxis with heparin subQ.  6.  CODE STATUS:  THE PATIENT IS FULL  CODE.   TIME SPENT:  45 minutes.     ____________________________ Cletis Athens. Hower, MD dkh:ea D: 12/18/2013 00:39:33 ET T: 12/18/2013 01:25:23 ET JOB#: [id number]  cc: Cletis Athens. Hower, MD, <Dictator> DAVID Synetta Shadow MD ELECTRONICALLY SIGNED 12/18/2013 20:23

## 2014-12-17 NOTE — Consult Note (Signed)
PATIENT NAME:  Natalie Sweeney, Natalie Sweeney MR#:  [id number] DATE OF BIRTH:  20-Apr-1946  DATE OF CONSULTATION:  03/02/2014  REQUESTING PHYSICIAN:  Dr. Delfino Lovett CONSULTING PHYSICIAN:  Stann Mainland. Sampson Goon, MD  REASON FOR CONSULTATION:  Multidrug-resistant Escherichia coli urinary tract infection.   HISTORY OF PRESENT ILLNESS: This is a very pleasant 69 year old female, complicated medical history including lupus and rheumatoid arthritis and stage III kidney disease. She has been following with Dr. Thedore Mins for resistant urinary tract infection. She has been failing 3 different antibiotic courses and continued to have dysuria and right flank pain for over a month. She was admitted to evaluate for pyelonephritis and for IV antibiotic therapy. OF NOTE, SHE IS ALLERGIC TO BACTRIM. She has had some low-grade fevers, but no other severe symptoms. She states that since admission, she has been feeling more nausea which she blames on the ertapenem she is currently on.   PAST MEDICAL HISTORY:  1. Lupus.  2. Rheumatoid arthritis.  3. Urinary tract infection.  4. Reflux esophagitis.   SOCIAL HISTORY: Quit smoking in 2011. She lives alone.   FAMILY HISTORY: Noncontributory.   REVIEW OF SYSTEMS:  Eleven systems reviewed and negative except as per HPI.   ALLERGIES: THE PATIENT SAYS SHE IS ALLERGIC TO BACTRIM. SHE IS ALSO LISTED AS BEING ALLERGIC TO PENICILLIN.   ANTIBIOTICS SINCE ADMISSION: Include ertapenem.   PHYSICAL EXAMINATION:  VITAL SIGNS: Temperature 98.4, pulse 71, blood pressure 145/83, respirations 18, saturation 95% on room air.   GENERAL: She is pleasant, interactive, in no acute distress.  HEENT: Pupils equal, round, and reactive to light and accommodation. Extraocular movements are intact. Sclerae anicteric. Oropharynx clear, but mucous membranes are dry.  NECK: Supple.  HEART: Regular.  LUNGS: Clear.  ABDOMEN: Soft, mildly distended.  EXTREMITIES: 1+ edema, bilateral lower extremity. She has  rheumatoid deformities of her bilateral hands. NEUROLOGICAL:  She is alert and oriented x 3. Grossly nonfocal neurologic exam.   LABORATORY, DIAGNOSTIC AND RADIOLOGICAL DATA:  I have reviewed labs from Dr. Doristine Church office. On June 23rd, she had urine sample which had 3+ leukocyte esterase and white blood cells greater than 30 and clumps of white blood cells present. Culture at that time grew Escherichia coli greater than 100,000 sensitive to cefepime, ceftriaxone, ertapenem, imipenem, nitrofurantoin, and intermediate to tobramycin and amoxicillin, clavulanic acid resistant to ampicillin, cefuroxime,  , ciprofloxacin, gentamicin, levofloxacin, piperacillin, tetracycline, and Bactrim. Urinalysis done June 12th showed 3+ leukocyte esterase as well. Urine done here in the hospital shows 127 white cells. Urine culture is pending. White blood count on admission was 10.0, hemoglobin 12.9, platelets 217,000. Renal function shows creatinine of 1.14.  Estimated GFR of 56. Ultrasound of her renal system shows bilateral simple renal cysts and evidence of chronic kidney disease, but no mass, hydronephrosis or calculi.   IMPRESSION: A pleasant 69 year old female with rheumatoid arthritis, as well as lupus on treatment who presents with recurrent and resistant urinary tract infection. She has a multi-drug-resistant Escherichia coli. Currently, she says she is feeling ill with nausea and she blames it on the ertapenem. SHE IS ALLERGIC TO PENICILLIN AND SULFA.   RECOMMENDATIONS:  Change ertapenem due to her symptoms. The organism was sensitive to ceftriaxone so we can switch to that.  At this point given her failure of outpatient treatment, I would suggest she have either IV or IM ceftriaxone. Would recommend treating her for a 10-day course. She does not have evidence of pyelonephritis.   Thank you for the consult.  I  will be glad to follow with you.    ____________________________ Stann Mainland. Sampson Goon,  MD dpf:dd D: 03/02/2014 16:23:53 ET T: 03/02/2014 19:17:01 ET JOB#: [id number]  cc: Stann Mainland. Sampson Goon, MD, <Dictator> Jannette Cotham Sampson Goon MD ELECTRONICALLY SIGNED 03/14/2014 21:18

## 2014-12-18 NOTE — Consult Note (Signed)
Chief Complaint:   Subjective/Chief Complaint patient seen and examined.  Please see full GI consult.  Patient admitted with UTI and pneumonia, concern for aspiration.  Recommend bedside swallowing eval, then full barium swallow, may need MBSS as this is more likely functional problem than stricture in the setting of 2 normal previous egd.  Will sheck SCL 70.   Following.   VITAL SIGNS/ANCILLARY NOTES: **Vital Signs.:   31-May-13 17:44   Vital Signs Type Routine   Temperature Temperature (F) 99.8   Celsius 37.6   Temperature Source oral   Pulse Pulse 92   Pulse source per Dinamap   Respirations Respirations 18   Systolic BP Systolic BP 158   Diastolic BP (mmHg) Diastolic BP (mmHg) 80   Mean BP 106   BP Source vital sign device   Pulse Ox % Pulse Ox % 92   Pulse Ox Activity Level  At rest   Oxygen Delivery 3L   Electronic Signatures: Barnetta Chapel (MD)  (Signed 31-May-13 19:51)  Authored: Chief Complaint, VITAL SIGNS/ANCILLARY NOTES   Last Updated: 31-May-13 19:51 by Barnetta Chapel (MD)

## 2014-12-18 NOTE — Consult Note (Signed)
PATIENT NAME:  Natalie Sweeney, Natalie Sweeney MR#:  [id number] DATE OF BIRTH:  1945-09-29  DATE OF CONSULTATION:  01/24/2012  REFERRING PHYSICIAN:  Dr. Imogene Burn CONSULTING PHYSICIAN:  Rosalyn Gess. Jarius Dieudonne, MD  REASON FOR CONSULTATION: Possible aspiration pneumonia and urinary tract infection.   HISTORY OF PRESENT ILLNESS: Patient is a 69 year old black female with a past history significant for breast cancer status post chemotherapy and gastroesophageal reflux disease who was admitted on 05/30 with acute onset of nausea, vomiting and diarrhea at 4:00 in the morning. Patient states that she had been in her usual state of health which in general is having multiple complaints. She had developed acute onset of nausea, vomiting and diarrhea at home. She had multiple episodes of emesis. She became very weak and pulled the cord for emergency services. She was brought to the Emergency Room for evaluation. She states that she had been having fevers, chills, and sweats. She has had malaise. She has had some confusion around the evenings events prior to her admission. She also was complaining of some shortness of breath and some productive cough. She has also been having polyuria with foul-smelling urine and some dysuria. She denies any hematuria. She states that the polyuria has been going on for months. She also states that she has been treated for bronchitis over the last few weeks in urgent care and has been given antibiotics. She does not recall which antibiotics nor when she completed the last course. She was found to be hypoxic in the Emergency Room and a CT scan showed interstitial infiltrates. She was started on Levaquin and admitted to the hospital. Overall she still feels poorly although she is not having further emesis or diarrhea. She has remained afebrile although her temperature has gotten as high as 100.8. Her saturations on 2 liters have been in the 90s.   ALLERGIES: Penicillin and sulfa.   PAST MEDICAL HISTORY:   1. Breast cancer in 2011 status post mastectomy. She did not tolerate chemotherapy.  2. Depression.  3. Hypertension.  4. Gastroesophageal reflux disease.  5. Systemic lupus erythematosus.  6. Anxiety.  7. Chronic back pain.  8. Concussion with loss of consciousness for 30 minutes.  9. There was previously a historical item of stroke in her past but she was seen by neurology who did not feel she had ever had a stroke.   SOCIAL HISTORY: The patient is a prior smoker, she quit in 2010. She lives by herself. She does not drink. She does not have any injecting drug use history. She has no pets at home.   FAMILY HISTORY: Positive for coronary artery disease, diabetes and lung cancer.    REVIEW OF SYSTEMS: GENERAL: Positive for fevers, chills, sweats, and malaise. HEENT: No headache. No sinus congestion. No sore throat. No swollen glands. NECK: No stiffness. No swollen glands. RESPIRATORY: Positive cough with some purulent sputum production, some shortness of breath. CARDIAC: No chest pains or palpitations. No peripheral edema. GASTROINTESTINAL: Positive nausea, vomiting and diarrhea. Minimal abdominal pain. No bloating. MUSCULOSKELETAL: No complaints. GENITOURINARY: She has dysuria, increased frequency but no hematuria. NEUROLOGIC: No focal weakness. PSYCHIATRIC: No complaints. SKIN: No rashes. All other systems are negative.   PHYSICAL EXAMINATION:  VITAL SIGNS: T-max 100.8, T-current 97.5, pulse 69, blood pressure 118/53, 92% on 2 liters.   GENERAL: 69 year old black female in no acute distress.   HEENT: Normocephalic, atraumatic. Pupils equal, reactive to light. Extraocular motion intact. Sclerae, conjunctivae, and lids are without evidence for emboli or petechiae. Oropharynx shows no  erythema or exudate. Gums are in fair condition.   NECK: Supple. Full range of motion. Midline trachea. No lymphadenopathy. No thyromegaly.   CHEST: Clear to auscultation bilaterally with the exception of some  occasional crackles in the bases. No tachypnea. Able to speak in full sentences.   CARDIAC: Regular rate and rhythm without murmur, rub, or gallop.   ABDOMEN: Soft, nontender, nondistended. No hepatosplenomegaly. No hernia is noted.   EXTREMITIES: No evidence for tenosynovitis.   SKIN: No rashes. No stigmata of endocarditis, specifically no Janeway lesions nor Osler nodes.   NEUROLOGIC: The patient is awake, interactive, moving all four extremities and appears to be mentating well.   PSYCHIATRIC: Mood and affect appeared normal for her.   LABORATORY, DIAGNOSTIC AND RADIOLOGICAL DATA: BUN 16, creatinine 1.15, bicarbonate 26, anion gap 10, white count 24.6 with hemoglobin 10.5, platelet count 256, ANC 20.9. On admission her white count was 6.8. LFTs were unremarkable. Blood cultures show no growth to date. Urinalysis had negative blood, positive nitrates, 3+ leukocyte esterase, 5 red cells and 567 white cells per high-powered field. ABG 7.42/45/50/87.8 on 36%. A chest x-ray from admission showed diffuse infiltrate in the right involving the upper and lower lobe consistent with a diffuse pneumonia. A CT scan of the chest showed no obvious evidence for pulmonary embolism. There is diffuse ground-glass opacities present in the right greater than the left, mainly sparing the upper lobes. I reviewed the CT with radiology and they felt this could certainly be from aspiration.   IMPRESSION: 69 year old black female with a history of breast cancer status post mastectomy, gastroesophageal reflux disease and SLE admitted with nausea, vomiting, diarrhea with probable aspiration pneumonitis and possible urinary tract infection.   RECOMMENDATIONS:  1. She is a relatively poor historian. She always has multiple complaints in the office. She states that she has been treated over the last few weeks at an urgent care for bronchitis and has received antibiotics. She does not know what she received. She reports that  her nausea, vomiting, diarrhea developed acutely. It is possible that she has gastroenteritis and subsequently aspirated from numerous emesis episodes. She could have primary urinary tract infection which caused her to have nausea, vomiting and then aspiration. She could have a primary pneumonia and developed further symptoms due to bacteremia. She could have GI symptoms due to the recent antibiotics. It is difficult to determine the chain of events leading up to her current condition.  2. Her CT scan shows interstitial infiltrates, however, the pattern is consistent with aspiration. Typically aspiration is chemical injury rather than infectious. Often does not require therapy. If therapy is given coverage for oral organisms and anaerobes is typically given such as Clindamycin. Levaquin would not adequately treat this as she does not appear to have community-acquired pneumonia. I would not change her therapy at this point, however.  3. Her urine appears to have evidence for inflammation. Will await the urine culture. Would continue levofloxacin until that returns. Will try and add the culture to the urine already obtained. If not possible will ask the nurses to collect a urine STAT before the antibiotics can affect the urine culture.  4. She had acute onset of nausea, vomiting, diarrhea. GI is to see her. She reports having been given antibiotics recently from urgent care, I would check for C. difficile PCR.  5. Will await the blood cultures.   Thank you very much for involving me in Ms. Caraher's care. This is a moderately complex infectious disease case.  ____________________________ Rosalyn Gess Param Capri, MD meb:cms D: 01/24/2012 15:24:45 ET T: 01/24/2012 16:00:38 ET JOB#: [id number]  cc: Rosalyn Gess. Emberley Kral, MD, <Dictator> Jayse Hodkinson E Raynold Blankenbaker MD ELECTRONICALLY SIGNED 02/04/2012 8:43

## 2014-12-18 NOTE — H&P (Signed)
PATIENT NAME:  Natalie Sweeney, Natalie Sweeney MR#:  [id number] DATE OF BIRTH:  03/24/1946  DATE OF ADMISSION:  01/23/2012  REFERRING PHYSICIAN: Janalyn Harder, MD  PRIMARY CARE PHYSICIAN: Orson Aloe, MD  CHIEF COMPLAINT: Nausea, vomiting, and diarrhea today.   HISTORY OF PRESENT ILLNESS: This is a 69 year old African American female with a history of connective tissue disease, rheumatoid arthritis, systemic lupus erythematosus, depression, breast cancer status post mastectomy, hypertension, hyperlipidemia, and asthma who presented to the ED with the above chief complaint. The patient said she was fine and has had constipation and chills this morning. She developed nausea, vomiting, and diarrhea at about 4 o'clock  a.m. In addition, the patient complains of lower abdominal cramps with dysuria and urinary frequency. The patient has had incontinence for a long time. In addition, the patient also has mild shortness of breath but denies any chest pain, palpitations, wheezing, cough, orthopnea, or nocturnal dyspnea. The patient's oxygen saturation was 85% in the ED  and pCO2 was 50. She was treated with oxygen and nebulizer. Chest x-ray showed right side pneumonia and then was treated with Levaquin.   PAST MEDICAL HISTORY: As mentioned above.  1. Connective tissue disease. 2. Hypertension. 3. Hyperlipidemia. 4. Systemic lupus erythematosus. 5. Rheumatoid arthritis. 6. Depression. 7. Breast cancer.   PAST SURGICAL HISTORY:  1. Status post mastectomy on the left side.  2. Hysterectomy.  3. Status post pericardial centesis in 1979.   DRUG ALLERGIES: Penicillin and sulfa drugs.  MEDICATIONS: 1. Acetaminophen/hydrocodone 325/5 mg p.o. three times daily p.r.n.  2. AcipHex 20 mg p.o. once daily. 3. Aspirin 81 mg p.o. daily.  4. Calcitonin once daily.  5. Calcium with vitamin D once daily. 6. Cetirizine 10 mg p.o. daily.  7. Cymbalta. 8. Diltiazem 240 mg p.o. daily.  9. Flonase 0.05 mg inhalation two  sprays once daily.  10. Hydrochloroquine 200 mg p.o. daily.  11. K-Dur 20 mg oral 1 tablet p.o. daily.  12. Lipitor 20 mg p.o. daily.  13. Nexium.  14. Percocet 1 tablet every six hours p.r.n.  15. Prednisone 2.5 mg p.o. daily.  16. Promethazine 25 mg every 6 hours p.r.n.  17. Tramadol 50 mg p.o. every four hours.  18. Trazodone 50 mg p.o. daily.  19. Triamterene 37.5/25 mg once daily.   FAMILY HISTORY: Both parents died probably secondary to cardiac cause.  SOCIAL HISTORY: The patient quit smoking three years ago. She denies any alcohol drinking or illicit drugs.  REVIEW OF SYSTEMS: CONSTITUTIONAL: The patient has fever, chills, and weakness, but no weight loss. EYES: No double vision or blurred vision. ENT: No tinnitus, postnasal drip, or epistaxis. RESPIRATORY: No cough, sputum, hematemesis or wheezing, but has mild shortness of breath. CARDIOVASCULAR: No chest pain, palpitation, orthopnea, or nocturnal dyspnea. GASTROINTESTINAL: Positive for abdominal pain, nausea, vomiting, and diarrhea, but no melena or bloody stools. GU: Positive for dysuria, urinary frequency, and incontinence, but no hematuria. HEMATOLOGY: No easy bruising or bleeding. MUSCULOSKELETAL: No joint pain. No edema. NEUROLOGY: No syncope, loss of consciousness, or seizure      PHYSICAL EXAMINATION:   VITAL SIGNS: Temperature 100.7, blood pressure 129/67, pulse 92, and oxygen saturation 94 in room air and now 95% on nasal cannula oxygen.   GENERAL: The patient is alert, awake, and oriented, in no acute distress.   HEENT: Pupils are round, equal, and reactive to light and accommodation. Mild dry oral mucosa.   NECK: Supple. No JVD or carotid bruit. No lymphadenopathy. No thyromegaly.   CARDIOVASCULAR: S1 and S2  regular rate and rhythm. No murmurs or gallops.   LUNGS: Bilateral air entry. No wheezing or rales, but has bilateral rhonchi.  ABDOMEN: Soft. No distention or tenderness. No organomegaly. Bowel sounds  present.   EXTREMITIES: No edema, clubbing, or cyanosis. No calf tenderness. Strong bilateral pedal pulses.   SKIN: No rash or jaundice.   NEUROLOGIC: Alert and oriented x3. No focal deficit. Power 5/5. Sensation intact.  LABS/STUDIES:  Urinalysis shows WBC 567, RBC 5, and nitrite positive.  D-dimer 2.26.   ABG: pH 7.42, pCO2 45, pO2 50, and lactic acid 1.1.   WBC 6.8 and hemoglobin 11.7. Glucose 80, BUN 19, creatinine 1.33, potassium 3.2, sodium 144, chloride 106, bicarbonate 27. Lipase 45. Troponin less than less than 0.02.   Chest x-ray: Diffuse infiltrate on the right involving the upper and lower lobe consistent with diffuse pneumonia.   IMPRESSION:  1. Urinary tract infection.  2. Pneumonia.  3. Hypoxia.  4. Elevated d-dimer, need to rule out pulmonary embolus.  5. Hypokalemia.  6. Hypertension, controlled.  7. Rheumatoid arthritis. 8. Systemic lupus erythematosus.  9. Urinary incontinence.  10. Hyperlipidemia.   PLAN OF TREATMENT: The patient will be admitted to the medical floor with telemonitoring. We will continue Levaquin and start nebulizer p.r.n. and followup CBC, urine culture, blood culture, and sputum culture. We will continue 02 by nasal cannula, start Lovenox 1 mg/kg subcutaneous every 12 hours, and follow up CT angiogram to rule out PE. In addition, we will give IV fluids. We will give potassium supplement and follow-up BMP and magnesium level. Continue hypertension medication but discontinue diuretics. GI prophylaxis.   I discussed the patient's situation and the plan of treatment with the patient.   TIME SPENT: Approximately 70 minutes. ____________________________ Shaune Pollack, MD qc:slb D: 01/23/2012 11:30:35 ET T: 01/23/2012 12:53:49 ET JOB#: [id number]  cc: Shaune Pollack, MD, <Dictator> Rosalyn Gess. Blocker, MD Shaune Pollack MD ELECTRONICALLY SIGNED 01/24/2012 23:38

## 2014-12-18 NOTE — Discharge Summary (Signed)
PATIENT NAME:  Natalie Sweeney, Natalie Sweeney MR#:  [id number] DATE OF BIRTH:  01/01/1946  DATE OF ADMISSION:  01/23/2012 DATE OF DISCHARGE:  02/04/2012   Please refer to the interim summary dictated by Dr. Luberta Mutter from 01/29/2012. This will cover from June 5th to her discharge on 02/04/2012.   ADMISSION DIAGNOSIS: Urinary tract infection with sepsis.   DISCHARGE DIAGNOSES:  1. Escherichia coli urinary tract infection resistant to Levaquin.  2. Acute respiratory failure due to bilateral pneumonia, likely aspiration chemical pneumonitis.  3. Hypertension.  4. Gastroesophageal reflux disease.  5. Acute renal failure.  6. Leukocytosis.   CONSULTS:  1. Dr. Leavy Cella  2. Dr. Marva Panda   HOSPITAL COURSE: The patient is a 69 year old female who presented with acute respiratory failure and urinary tract infection. For further details, please refer to the history and physical.  1. Escherichia coli urinary tract infection resistant to Levaquin, changed on 01/29/2012 to Ertapenem by Dr. Leavy Cella. On 06/07 ertapenem was changed to clindamycin and Aztreonam due to concern for allergic reaction to ertapenem with increasing white blood cells. She has completed the treatment of her urinary tract infection. Her urinalysis at discharge is normal.  2. Acute respiratory failure due to bilateral pneumonia, likely aspiration with chemical pneumonitis. I appreciate ID and Pulmonary consults. She is being weaned from her steroids. She was actually on high flow oxygen at one point, however, this is being weaned off and she does not require any oxygen currently. She was changed to p.o. clinda and Levaquin.  3. Hypertension, which was stable on diltiazem.  4. Gastroesophageal reflux disease. There are no issues.  5. Acute renal failure likely related to sepsis and pneumonia which is resolved.   6. Leukocytosis, seems to fluctuate, initially thought to be secondary to ertapenem allergic reaction. However, it is still slightly  increased. She will need close follow-up for her white blood cell count. She has been afebrile.   DISCHARGE MEDICATIONS:  1. Hydroxychloroquine 200 mg daily.  2. Seroquel 150 mg daily. 3. Diltiazem 360 daily.  4. Ambien 10 mg at bedtime.  5. Cymbalta 60 mg daily.  6. ProAir HFA 90 mcg 2 puffs q.4 to 6 hours p.r.n.  7. Fluticasone 50 mcg two sprays nasally daily.  8. Lipitor 10 mg daily.  9. Calcitonin 200 international units daily.  10. Dexilant 60 mg daily. 11. Prednisone taper starting at 60 mg, taper by 10 mg every two days. After she has completed that, she will continue with prednisone 2.5 mg daily.  12. Levaquin 750 mg every 48 hours through June 21st.  13. Clindamycin 300 mg t.i.d. through June 21st.  14. Calcium carbonate 500 mg b.i.d.   DISCHARGE DIET: Low sodium.   DISCHARGE ACTIVITY: As tolerated.   DISCHARGE REFERRAL: Physical therapy.    DISCHARGE FOLLOW-UP: The patient will need to follow-up with Dr. Meredeth Ide in one week.  TIME SPENT: 35 minutes.   ____________________________ Janyth Contes. Juliene Pina, MD spm:drc D: 02/04/2012 12:58:44 ET T: 02/04/2012 13:07:27 ET JOB#: [id number]  cc: Paizlie Klaus P. Juliene Pina, MD, <Dictator>, Herbon E. Meredeth Ide, MD Janyth Contes Arad Burston MD ELECTRONICALLY SIGNED 02/05/2012 12:27

## 2014-12-18 NOTE — Consult Note (Signed)
Chief Complaint:   Subjective/Chief Complaint seems mor comfortable today.  denies nausea or abdominal pain.  eating a regular diet tray without difficulty.   VITAL SIGNS/ANCILLARY NOTES: **Vital Signs.:   02-Jun-13 12:27   Vital Signs Type Routine   Temperature Temperature (F) 98.5   Celsius 36.9   Temperature Source oral   Pulse Pulse 88   Pulse source per Dinamap   Respirations Respirations 20   Systolic BP Systolic BP 145   Diastolic BP (mmHg) Diastolic BP (mmHg) 80   Mean BP 101   BP Source vital sign device   Pulse Ox % Pulse Ox % 95   Pulse Ox Activity Level  At rest   Oxygen Delivery HFNC   Brief Assessment:   Cardiac Regular    Respiratory wheezing  rhonchi    Gastrointestinal details normal Soft  Nontender  No masses palpable  Bowel sounds normal  protuberant   Assessment/Plan:  Assessment/Plan:   Assessment 1) possible aspiration with pneumonia.  Patient has h/o multiple esophageal evaluations in the past including at least 3 egd.  No evidence of strictures.  Patient with studies c/w presbyesophagus.  She has had a bedside sw eval.  Agree with speech recs.  Consider MBSS as clinically feasible.  Due to respiratory status, not currently a candidate for egd, which may not be needed at this time.   2) pneumonitis, UTI-on abx.    Plan 1) continue current, as above.   Electronic Signatures: Barnetta Chapel (MD)  (Signed 02-Jun-13 13:58)  Authored: Chief Complaint, VITAL SIGNS/ANCILLARY NOTES, Brief Assessment, Assessment/Plan   Last Updated: 02-Jun-13 13:58 by Barnetta Chapel (MD)

## 2014-12-18 NOTE — Consult Note (Signed)
Impression: 69yo BF w/ h/o breast CA, s/p mastectomy, GERD, SLE admitted with N/V/D with probable aspiration pneumonitis and possible UTI.  She is a relatively poor historian.  She always has multiple complaints in the office.  She states that she has been treated over the last few weeks at urgent care for bronchitis and has received antibiotics.  She does not know what she received.  She reports that her N/V/D developed acutely.  It is possible that she has gastroenteritis and subsequently aspirated.  She could have a primary UTI which caused her to have nausea and vomiting then aspiration.  She could have a primary pneumonia and developed her other symptoms due to bacteremia.  She could have the GI symptoms due to the recent antibiotics.   It is difficult to determine the chain of events leading up to her current condition. Her CT shows interstitial infiltrates. The pattern is consistent with aspiration.  Typically aspiration is a chemical injury rather than infectious.  It often does not require therapy.  If therapy is given, coverage for oral organisms therapy and anaerobes is typically given, such as clindamycin.  Levofloxacin would not adequately treat this as it does not appear to be CAP.  I would not change her therapy at this point, however. Her urine appears to have evidence for inflammation.  Will await the urine culture.  Continue levofloxacin until that returns. She had acute onset of N/V/D.  GI to see her.  As she reports having been given antibiotics recently from urgent care, I would check C. diff PCR. Will await BCx.   Electronic Signatures: Rudolph Daoust, Rosalyn Gess (MD) (Signed on 31-May-13 15:13)  Authored   Last Updated: 31-May-13 15:24 by Deaundra Dupriest, Rosalyn Gess (MD)

## 2014-12-18 NOTE — Consult Note (Signed)
Brief Consult Note: Comments: saw pt at her request. chart reviewed and pt briefly examined . no significant issues related to her connective tissue disease noted.thanks for your care of her. i left my last office notes in chart if needed.  Electronic Signatures: Royann Shivers., Helen Hashimoto (MD)  (Signed 10-Jun-13 13:13)  Authored: Brief Consult Note   Last Updated: 10-Jun-13 13:13 by Royann Shivers., Helen Hashimoto (MD)

## 2014-12-18 NOTE — Consult Note (Signed)
PATIENT NAME:  Natalie Sweeney, Natalie Sweeney MR#:  [id number] DATE OF BIRTH:  July 15, 1946  DATE OF CONSULTATION:  01/24/2012  REFERRING PHYSICIAN:  Dr. Imogene Burn CONSULTING PHYSICIAN:  Ranae Plumber. Arvilla Market, ANP / Dr. Barnetta Chapel   REASON FOR CONSULTATION: Possible aspiration secondary to esophageal reflux.   HISTORY OF PRESENT ILLNESS: This 69 year old African American female with a significant past medical history of connective tissue disease, rheumatoid arthritis, systemic lupus erythematosus, breast cancer status post mastectomy, depression and asthma presented to the Emergency Room on 01/23/2012 for nausea, vomiting, urinary frequency, hot, chills, and just feeling overall poorly. Patient reports for the last couple of months she has been dealing with bronchitis as well urinary frequency and has been on several rounds of antibiotics for both infections. She reports on Wednesday she was sleeping, got up and had a bowel movement, en route to bed she developed nausea, went back and had vomiting. She vomited, passed a few more stools but states they were formed, no blood or melena or hematemesis. The vomiting was a couple episodes that night, she felt ill all over. She did not aspirate. Patient did not vomit in her sleep or lying supine. She did contact 911 and presented to the Emergency Room. She has had problems with shortness of breath and cough, her oxygen sats were 85%, pCO2 was elevated at 50, and chest x-ray showed right-sided pneumonia. She has been on renal dose Levaquin. Urinalysis is grossly positive with cultures pending.    Currently, patient says she is feeling a little better since she has been in the hospital for two days but did have nausea this morning without vomiting that required Zofran. She has had no further nausea. She has tolerated a soft to chew diet today and says she ate a tuna fish sandwich, soft fruit, and potatoes with gravy for lunch and did quite well. She has noted no food dysphagia,  coughing or choking when eating. Sometimes she has trouble chewing, with her upper plate, and has to be careful to chew well and eat slowly. She has been unaware of any choking or reflux spells. She does cough sometimes after eating, not frequent. Most recent EGD performed 2011 for dysphagia by Dr. Niel Hummer showed normal stomach, normal duodenum and a small hiatus hernia. He suspected a probable esophageal motility disorder. Patient underwent bedside speech swallow test today, and according to the nursing report, there was no significant problem. No significant problem found but I do not have the formal report to review. Patient has not had a recent barium swallow or modified barium swallow study.   PAST MEDICAL HISTORY:  1. Depression.  2. Hypertension.  3. Rheumatoid arthritis. 4. Lupus erythematosus.  5. Breast cancer.  6. History of hepatitis A.  7. Hyperlipidemia.   PAST SURGICAL HISTORY:  1. Mastectomy on the left side.  2. Hysterectomy.  3. Status post pericardiocentesis in 1979.  4. Known history of chronic gastritis with previous treatment H. pylori documented eradication 2010.  5. History of valvular heart disease.  6. Mixed connective tissue disorder.  7. Osteoporosis.  8. Previous stomach ulcers.  9. Renal insufficiency.   HOME MEDICATIONS: Patient is unable to list admission medications. See admission history and physical to include:  1. Acetaminophen/hydrocodone 325/5 mg 3 times daily p.r.n.  2. AcipHex 20 mg once daily.  3. Aspirin 81 mg daily.  4. Calcitonin once daily.  5. Calcium with vitamin D once daily.  6. Cetirizine 10 mg p.o. daily.  7. Cymbalta.  8. Diltiazem 240  mg daily.  9. Flonase 0.5 mg inhalation two sprays once daily.  10. Hydrochloroquine 200 mg daily.  11. K-Dur 20 mg daily.  12. Lipitor 20 mg daily.  13. Nexium.  14. Percocet 1 tablet every six hours p.r.n.  15. Prednisone 2.5 mg daily. 16. Promethazine 25 mg every six hours as needed.   17. Tramadol 50 mg every four hours. 18. Trazodone 50 mg at bedtime.  19. Triamterene 37.5/25 mg once daily.   ALLERGIES: Penicillin, sulfa drugs.   HABITS: Previous tobacco, quit three years ago. Negative alcohol.   FAMILY HISTORY: Brother had colon cancer. Daughter with brain cancer, treated, survived. Another brother with prostate cancer.   REVIEW OF SYSTEMS: 10 systems reviewed. Positive for just overall feeling poorly, lower abdominal pain has been present for months, urinary frequency 5 to 10 minutes. Patient thinks her lower abdominal discomfort is secondary to urinary tract infection. Looking through GI chart review she has had chronic abdominal pain in the past extensively evaluated. Patient currently has pain over the lower abdomen, radiates into the back, waxes and wanes. Little bit of nausea today. No vomiting, she has had no fevers or chills. No chest pain. Does still have a little shortness of breath, some cough, feels weak. Colonoscopy 2008 showed diverticulosis, advanced to the cecum. Patient cannot recall diverticulitis. She has had no specific area of abdominal pain and no specific area of abdominal pain. She reports chronic constipation, has had more of a normal stool but would not describe it as diarrhea. She has had multiple antibiotic exposure. Stool studies are pending. Remaining 10 systems negative.   PHYSICAL EXAMINATION:  VITAL SIGNS: Temperature 97.5, pulse 59, respirations 18, blood pressure 108/53, pulse oximetry 90% to 92% 2 liters nasal cannula.   GENERAL: Elderly, obese African American female resting in bed.   HEENT: Head is normocephalic. Conjunctivae pink. Sclerae anicteric. Oral mucosa is dry and intact.   NECK: Supple. Trachea midline.   HEART: Heart tones S1, S2 without murmur or gallop.   LUNGS: Lungs have decreased breath sounds, crackles throughout, left greater than right.   ABDOMEN: Soft. Bowel sounds present. Reports lower abdominal tenderness  bilateral, no specific area. Unable to palpate for hepatosplenomegaly or masses. Positive tenderness over the bladder. Foley catheter intact draining yellow urine.   RECTAL: Deferred.   EXTREMITIES: Scant lower extremity edema is present.   SKIN: Warm and dry.    MUSCULOSKELETAL: Rheumatoid arthritis changes noted to the hands. Gait not evaluated. No joint swelling.   NEUROLOGICAL: Cranial nerves II through XII grossly intact.   PSYCH: Affect and mood is pleasant, visiting with son, Caryn Bee, at the bedside. She is alert and oriented. Vague historian.   LABORATORY, DIAGNOSTIC, AND RADIOLOGICAL DATA: Admission blood work with BUN 19, creatinine 1.33, potassium 3.2, lipase 45, albumin 2.6, total bilirubin 0.3, alkaline phosphatase 70, AST 21, ALT 12, troponin less than 0.02, hemoglobin 11.7, WBC 6.8, d-dimer 2.26. Blood culture no growth. Urinalysis cloudy yellow, protein is positive, leukocyte esterase positive, WBC 567 per high-power field, 2+ bacteria, mucus and WBC clumps are present. Culture is pending.   Laboratory study 01/24/2012: BUN 16, creatinine 1.15, potassium 3.3, calcium 8.2, magnesium 1.5, WBC 24.6, hemoglobin 10.5. Patient did receive Solu-Medrol injection and her usual dose of prednisone. She is on renal dosed Levaquin.   Chest x-ray shows diffuse infiltrate on the right involving upper lobe and lower lobe consistent with diffuse pneumonia.   CT scan of the chest rule out pulmonary embolism showed no evidence  of pulmonary embolism but there is ground glass opacities throughout the majority of the right upper lobe. These are seen in the right lower lobe as well. Some opacities are seen in the left lower lobe. No aggressive lytic or sclerotic osseous lesions are identified. Scattered round foci of sclerosis of the thoracic skeleton likely secondary to bone island.   IMPRESSION: Patient presents status post outpatient failure for bronchitis, urinary tract infection and has progressed  to pneumonia as well as a significant urinary tract infection. Culture pending. She presents with nausea, vomiting, feeling sick, had a T-max of 100.8, WBCs initially normal, did receive IV Solu-Medrol and increase in leukocytosis noted. Patient is experiencing very low abdominal discomfort, wraps around the back. She has had urinary frequency q.5 minutes and attributes this discomfort to her significant urinary tract infection. She had nausea this morning, managed with Zofran. No further vomiting since admission. She reports a small formed stool today. No evidence of diarrhea, blood or melena. Patient is tolerating her diet well, able to swallow soft formed foods, and has noted no choking, coughing, dysphagia or aspiration problems. Patient did undergo a bedside swallowing study today and per nursing reports there was no significant aspiration or problems. She does have a long history of gastritis, previous H. pylori treatment with eradication 2010, remote history of ulcer, and most recent EGD 2011 was totally normal with the exception of small hiatus hernia. Certainly patients with mixed connective tissue disease can develop esophageal dysmotility and abnormality. The next test for this patient would be to consider first phase barium swallow and second phase modified barium swallow with speech therapy. However, more pressing issues are at hand, as patient is not demonstrating coughing or choking with eating, her history does not support an aspiration event, and would recommend continued treatment for urinary tract infection and pneumonia. This case was discussed with Dr. Marva Panda in collaboration of care.   These services provided by Cala Bradford A. Arvilla Market, MS, APRN, BC, ANP under collaborative agreement with Dr. Marva Panda.  ____________________________ Ranae Plumber. Arvilla Market, ANP kam:cms D: 01/24/2012 18:00:49 ET T: 01/25/2012 10:06:00 ET JOB#: [id number]  cc: Cala Bradford A. Arvilla Market, ANP, <Dictator> Ranae Plumber. Suzette Battiest,  MSN, ANP-BC Adult Nurse Practitioner ELECTRONICALLY SIGNED 01/27/2012 8:38

## 2014-12-18 NOTE — Consult Note (Signed)
Chief Complaint:   Subjective/Chief Complaint wants to go home.  still sob, on high flow o2 [place]. tolerating loft diet.  denies nausea or abdominal pain, continues with constipation.   VITAL SIGNS/ANCILLARY NOTES: **Vital Signs.:   03-Jun-13 08:00   Temperature Temperature (F) 99.2   Celsius 37.3   Temperature Source oral   Pulse Pulse 79   Pulse source per [employer]am[employer]   Respirations Respirations 19   Systolic BP Systolic BP 259   Diastolic BP (mmHg) Diastolic BP (mmHg) 81   Mean BP 98   BP Source vital sign device   Pulse Ox % Pulse Ox % 97   Pulse Ox Activity Level  At rest   Oxygen Delivery HFNC 58.9%    08:20   Pulse Ox % Pulse Ox % 94   Pulse Ox Activity Level  At rest   Oxygen Delivery HFNC 50L @ 50%   Brief Assessment:   Cardiac Regular    Respiratory wheezing  rhonchi  improved from yesterday    Gastrointestinal details normal Soft  Nontender  Nondistended  No masses palpable  Bowel sounds normal  protuberant   Lab Results: Routine Chem:  03-Jun-13 04:52    Glucose, Serum 99   BUN  27   Creatinine (comp)  1.41   Sodium, Serum 142   Potassium, Serum 3.8   Chloride, Serum 107   CO2, Serum 25   Calcium (Total), Serum 8.6   Anion Gap 10   Osmolality (calc) 288   eGFR (African American)  44   eGFR (Non-African American)  38 (eGFR values <30m/min/1.73 m2 may be an indication of chronic kidney disease (CKD). Calculated eGFR is useful in patients with stable renal function. The eGFR calculation will not be reliable in acutely ill patients when serum creatinine is changing rapidly. It is not useful in  patients on dialysis. The eGFR calculation may not be applicable to patients at the low and high extremes of body sizes, pregnant women, and vegetarians.)  Routine Hem:  03-Jun-13 10:42    WBC (CBC)  20.3   RBC (CBC) 4.09   Hemoglobin (CBC)  10.4   Hematocrit (CBC)  33.6   Platelet Count (CBC) 250   MCV 82   MCH  25.5   MCHC  31.1   RDW  17.1   Neutrophil %  81.9   Lymphocyte % 8.3   Monocyte % 9.5   Eosinophil % 0.0   Basophil % 0.3   Neutrophil #  16.6   Lymphocyte # 1.7   Monocyte #  1.9   Eosinophil # 0.0   Basophil # 0.1 (Result(s) reported on 27 Jan 2012 at 11:22AM.)   Assessment/Plan:  Assessment/Plan:   Assessment 1) dysphagia-case reviewed with speech path. long h/o esophageal dysmotility/presesophagus/   Agree with recommendations of slowing the rate of eating and alternating liquids and solids.  I observed her eating over the weekend several times and she tends to take several bites rapidly without swallowing or allowing time in between.. Discussed this with speech path.  KBelenda Cruisewill bring by some information for the patient in this regard.  No plans for luminal evaluation or repeat MBSS.    Plan as above. signing off, reconsult as needed.   Electronic Signatures: SLoistine Simas(MD)  (Signed 03-Jun-13 14:20)  Authored: Chief Complaint, VITAL SIGNS/ANCILLARY NOTES, Brief Assessment, Lab Results, Assessment/Plan   Last Updated: 03-Jun-13 14:20 by SLoistine Simas(MD)

## 2014-12-18 NOTE — Consult Note (Signed)
Brief Consult Note: Diagnosis: Chronic GERD-stable, no dysphagia or aspiration event reported, positive nausea today, vomiting on admission likely releated to pneumonia and UTI.   Patient was seen by consultant.   Consult note dictated.   Comments: Await bedside swallow study results. Tolerating soft diet well. When medically feasible could do ba swallow and then modified ba swallow if aspiration secondary to GERD is still a concern. EGD done in 2011 normal with exception of small HH, as well as EGD in 2010-would not repeat as patient is not reporting choking, coughing, or food hanging with eating. Poor fitting top plate-keeps her from "chewing as well I like"-but she is careful. She has very low bilat abdominal pain radiates into the back. This started with UTI complaints. No diarrhea-but having BM daily and usually has baseline constipation. Plan: Monitor swallowing and treat current infections. Consider further ba testing as needed. Case d/w Dr. Marva Panda in collaboration of care.  Electronic Signatures: Rowan Blase (NP)  (Signed [phone] 18:08)  Authored: Brief Consult Note   Last Updated: 31-May-13 18:08 by Rowan Blase (NP)

## 2015-02-27 ENCOUNTER — Emergency Department: Payer: Medicare Other

## 2015-02-27 ENCOUNTER — Emergency Department
Admission: EM | Admit: 2015-02-27 | Discharge: 2015-02-27 | Disposition: A | Discharge status: Home / Self Care | Payer: Medicare Other | Attending: Emergency Medicine | Admitting: Emergency Medicine

## 2015-02-27 DIAGNOSIS — M069 Rheumatoid arthritis, unspecified: Secondary | ICD-10-CM | POA: Diagnosis not present

## 2015-02-27 DIAGNOSIS — S8011XA Contusion of right lower leg, initial encounter: Secondary | ICD-10-CM | POA: Insufficient documentation

## 2015-02-27 DIAGNOSIS — S8992XA Unspecified injury of left lower leg, initial encounter: Secondary | ICD-10-CM | POA: Diagnosis present

## 2015-02-27 DIAGNOSIS — Y998 Other external cause status: Secondary | ICD-10-CM | POA: Diagnosis not present

## 2015-02-27 DIAGNOSIS — W01198A Fall on same level from slipping, tripping and stumbling with subsequent striking against other object, initial encounter: Secondary | ICD-10-CM | POA: Insufficient documentation

## 2015-02-27 DIAGNOSIS — I10 Essential (primary) hypertension: Secondary | ICD-10-CM | POA: Diagnosis not present

## 2015-02-27 DIAGNOSIS — Y9289 Other specified places as the place of occurrence of the external cause: Secondary | ICD-10-CM | POA: Insufficient documentation

## 2015-02-27 DIAGNOSIS — W19XXXA Unspecified fall, initial encounter: Secondary | ICD-10-CM

## 2015-02-27 DIAGNOSIS — Y9389 Activity, other specified: Secondary | ICD-10-CM | POA: Diagnosis not present

## 2015-02-27 DIAGNOSIS — S80212A Abrasion, left knee, initial encounter: Secondary | ICD-10-CM

## 2015-02-27 DIAGNOSIS — Z880 Allergy status to penicillin: Secondary | ICD-10-CM | POA: Insufficient documentation

## 2015-02-27 MED ORDER — HYDROCODONE-ACETAMINOPHEN 5-325 MG PO TABS: 1.0000 | ORAL_TABLET | Freq: Four times a day (QID) | ORAL | Status: AC | PRN

## 2015-02-27 MED ORDER — HYDROCODONE-ACETAMINOPHEN 5-325 MG PO TABS
1.0000 | ORAL_TABLET | Freq: Once | ORAL | Status: AC
  Administered 2015-02-27: 1 via ORAL

## 2015-02-27 MED ORDER — ONDANSETRON 4 MG PO TBDP
ORAL_TABLET | ORAL | Status: AC
Start: 2015-02-27 — End: 2015-02-27
  Administered 2015-02-27: 4 mg via ORAL
  Filled 2015-02-27: qty 1

## 2015-02-27 MED ORDER — HYDROCODONE-ACETAMINOPHEN 5-325 MG PO TABS
ORAL_TABLET | ORAL | Status: AC
  Administered 2015-02-27: 1 via ORAL
  Filled 2015-02-27: qty 1

## 2015-02-27 MED ORDER — ONDANSETRON 4 MG PO TBDP
4.0000 mg | ORAL_TABLET | Freq: Once | ORAL | Status: AC
  Administered 2015-02-27: 4 mg via ORAL

## 2015-02-27 NOTE — Discharge Instructions (Signed)
1. Take pain medicine as needed (Norco #15 and (. 2. Elevate affected area several times daily and apply ice to reduce swelling. 3. Return to the ER for worsening symptoms, increased swelling, redness, streaking or other concerns.  Fall Prevention and Home Safety Falls cause injuries and can affect all age groups. It is possible to use preventive measures to significantly decrease the likelihood of falls. There are many simple measures which can make your home safer and prevent falls. OUTDOORS  Repair cracks and edges of walkways and driveways.  Remove high doorway thresholds.  Trim shrubbery on the main path into your home.  Have good outside lighting.  Clear walkways of tools, rocks, debris, and clutter.  Check that handrails are not broken and are securely fastened. Both sides of steps should have handrails.  Have leaves, snow, and ice cleared regularly.  Use sand or salt on walkways during winter months.  In the garage, clean up grease or oil spills. BATHROOM  Install night lights.  Install grab bars by the toilet and in the tub and shower.  Use non-skid mats or decals in the tub or shower.  Place a plastic non-slip stool in the shower to sit on, if needed.  Keep floors dry and clean up all water on the floor immediately.  Remove soap buildup in the tub or shower on a regular basis.  Secure bath mats with non-slip, double-sided rug tape.  Remove throw rugs and tripping hazards from the floors. BEDROOMS  Install night lights.  Make sure a bedside light is easy to reach.  Do not use oversized bedding.  Keep a telephone by your bedside.  Have a firm chair with side arms to use for getting dressed.  Remove throw rugs and tripping hazards from the floor. KITCHEN  Keep handles on pots and pans turned toward the center of the stove. Use back burners when possible.  Clean up spills quickly and allow time for drying.  Avoid walking on wet floors.  Avoid hot  utensils and knives.  Position shelves so they are not too high or low.  Place commonly used objects within easy reach.  If necessary, use a sturdy step stool with a grab bar when reaching.  Keep electrical cables out of the way.  Do not use floor polish or wax that makes floors slippery. If you must use wax, use non-skid floor wax.  Remove throw rugs and tripping hazards from the floor. STAIRWAYS  Never leave objects on stairs.  Place handrails on both sides of stairways and use them. Fix any loose handrails. Make sure handrails on both sides of the stairways are as long as the stairs.  Check carpeting to make sure it is firmly attached along stairs. Make repairs to worn or loose carpet promptly.  Avoid placing throw rugs at the top or bottom of stairways, or properly secure the rug with carpet tape to prevent slippage. Get rid of throw rugs, if possible.  Have an electrician put in a light switch at the top and bottom of the stairs. OTHER FALL PREVENTION TIPS  Wear low-heel or rubber-soled shoes that are supportive and fit well. Wear closed toe shoes.  When using a stepladder, make sure it is fully opened and both spreaders are firmly locked. Do not climb a closed stepladder.  Add color or contrast paint or tape to grab bars and handrails in your home. Place contrasting color strips on first and last steps.  Learn and use mobility aids as needed.  Install an electrical emergency response system.  Turn on lights to avoid dark areas. Replace light bulbs that burn out immediately. Get light switches that glow.  Arrange furniture to create clear pathways. Keep furniture in the same place.  Firmly attach carpet with non-skid or double-sided tape.  Eliminate uneven floor surfaces.  Select a carpet pattern that does not visually hide the edge of steps.  Be aware of all pets. OTHER HOME SAFETY TIPS  Set the water temperature for 120 F (48.8 C).  Keep emergency numbers on  or near the telephone.  Keep smoke detectors on every level of the home and near sleeping areas. Document Released: 08/02/2002 Document Revised: 02/11/2012 Document Reviewed: 11/01/2011 [facility] Patient Information 2015 [place], [place]. This information is not intended to replace advice given to you by your health care provider. Make sure you discuss any questions you have with your health care provider.  Contusion A contusion is a deep bruise. Contusions are the result of an injury that caused bleeding under the skin. The contusion may turn blue, purple, or yellow. Minor injuries will give you a painless contusion, but more severe contusions may stay painful and swollen for a few weeks.  CAUSES  A contusion is usually caused by a blow, trauma, or direct force to an area of the body. SYMPTOMS   Swelling and redness of the injured area.  Bruising of the injured area.  Tenderness and soreness of the injured area.  Pain. DIAGNOSIS  The diagnosis can be made by taking a history and physical exam. An X-ray, CT scan, or MRI may be needed to determine if there were any associated injuries, such as fractures. TREATMENT  Specific treatment will depend on what area of the body was injured. In general, the best treatment for a contusion is resting, icing, elevating, and applying cold compresses to the injured area. Over-the-counter medicines may also be recommended for pain control. Ask your caregiver what the best treatment is for your contusion. HOME CARE INSTRUCTIONS   Put ice on the injured area.  Put ice in a plastic bag.  Place a towel between your skin and the bag.  Leave the ice on for 15-20 minutes, 3-4 times a day, or as directed by your health care provider.  Only take over-the-counter or prescription medicines for pain, discomfort, or fever as directed by your caregiver. Your caregiver may recommend avoiding anti-inflammatory medicines (aspirin, ibuprofen, and naproxen) for 48 hours  because these medicines may increase bruising.  Rest the injured area.  If possible, elevate the injured area to reduce swelling. SEEK IMMEDIATE MEDICAL CARE IF:   You have increased bruising or swelling.  You have pain that is getting worse.  Your swelling or pain is not relieved with medicines. MAKE SURE YOU:   Understand these instructions.  Will watch your condition.  Will get help right away if you are not doing well or get worse. Document Released: 05/22/2005 Document Revised: 08/17/2013 Document Reviewed: 06/17/2011 [facility] Patient Information 2015 [place], [place]. This information is not intended to replace advice given to you by your health care provider. Make sure you discuss any questions you have with your health care provider.  Abrasion An abrasion is a cut or scrape of the skin. Abrasions do not extend through all layers of the skin and most heal within 10 days. It is important to care for your abrasion properly to prevent infection. CAUSES  Most abrasions are caused by falling on, or gliding across, the ground or other  surface. When your skin rubs on something, the outer and inner layer of skin rubs off, causing an abrasion. DIAGNOSIS  Your caregiver will be able to diagnose an abrasion during a physical exam.  TREATMENT  Your treatment depends on how large and deep the abrasion is. Generally, your abrasion will be cleaned with water and a mild soap to remove any dirt or debris. An antibiotic ointment may be put over the abrasion to prevent an infection. A bandage (dressing) may be wrapped around the abrasion to keep it from getting dirty.  You may need a tetanus shot if:  You cannot remember when you had your last tetanus shot.  You have never had a tetanus shot.  The injury broke your skin. If you get a tetanus shot, your arm may swell, get red, and feel warm to the touch. This is common and not a problem. If you need a tetanus shot and you choose not to have  one, there is a rare chance of getting tetanus. Sickness from tetanus can be serious.  HOME CARE INSTRUCTIONS   If a dressing was applied, change it at least once a day or as directed by your caregiver. If the bandage sticks, soak it off with warm water.   Wash the area with water and a mild soap to remove all the ointment 2 times a day. Rinse off the soap and pat the area dry with a clean towel.   Reapply any ointment as directed by your caregiver. This will help prevent infection and keep the bandage from sticking. Use gauze over the wound and under the dressing to help keep the bandage from sticking.   Change your dressing right away if it becomes wet or dirty.   Only take over-the-counter or prescription medicines for pain, discomfort, or fever as directed by your caregiver.   Follow up with your caregiver within 24-48 hours for a wound check, or as directed. If you were not given a wound-check appointment, look closely at your abrasion for redness, swelling, or pus. These are signs of infection. SEEK IMMEDIATE MEDICAL CARE IF:   You have increasing pain in the wound.   You have redness, swelling, or tenderness around the wound.   You have pus coming from the wound.   You have a fever or persistent symptoms for more than 2-3 days.  You have a fever and your symptoms suddenly get worse.  You have a bad smell coming from the wound or dressing.  MAKE SURE YOU:   Understand these instructions.  Will watch your condition.  Will get help right away if you are not doing well or get worse. Document Released: 05/22/2005 Document Revised: 07/29/2012 Document Reviewed: 07/16/2011 [facility] Patient Information 2015 [place], [place]. This information is not intended to replace advice given to you by your health care provider. Make sure you discuss any questions you have with your health care provider.

## 2015-02-27 NOTE — ED Notes (Signed)
Patient to triage via wheelchair by EMS.  Per EMS patient fell at approx 7pm last evening and complaining of leg pain.  Patient reports tripped over piece of wood and landed on her knees and legs.  Patient c/o bilateral lower leg pain.  Noted bruising and swelling to right anterior lower leg.

## 2015-02-27 NOTE — ED Provider Notes (Signed)
[facility] Emergency Department Provider Note  ____________________________________________  Time seen: Approximately 5:43 AM  I have reviewed the triage vital signs and the nursing notes.   HISTORY  Chief Complaint Fall    HPI Natalie Sweeney is a 69 y.o. female who arrives to the ED via EMS s/p mechanical fall approximately 7 PM last evening. Patient tripped going indoors and fell, striking both legs. Complains of 7/10 pain to right shin and left knee pain. Patient took a tramadol prior to arrival without relief of symptoms. Patient denies head injury or LOC. Denies neck pain, chest pain, shortness of breath, weakness, numbness, tingling.Patient takes a baby aspirin only.   Past medical history Breast cancer  in 2011s/p mastectomy, did not tolerate chemo. Followed by Dr. Neale Burly  Depression    Essential hypertension, benign    GERD (gastroesophageal reflux disease)    Anxiety    Backache, unspecified    Concussion with loss of consciousness of 30 minutes or less    Migraines 11/11/2013   Valvular heart disease    Tobacco abuse    History of stomach ulcers    Osteoporosis    Hepatitis  remote, thought to be Hepatitis A  Hard of hearing    Anemia    Congestive heart failure    DJD of shoulder    Renal insufficiency    Mixed connective tissue disease  Positive FANA and RNP. Negative anti DNA. Negative anti quiet anticoagulant. Prior pericardial effusion. Erosive arthritis with ulnar drift. Rheumatoid factor negative. Chronic steroids. Plaquenil. Inflammatory eye disease, iritis 2007  SOB (shortness of breath)    Edema    Rheumatoid arthritis    There are no active problems to display for this patient.  Past surgical history Mastectomy    Colonoscopy 06/03/2012 11/06/2006. Diverticulosis, hyperplastic polyps, repeat 10 years, Dr. Bluford Kaufmann.  Egd 05/2012 reflux esophagitis  Hysterectomy Supracervical Abdominal  W/Removal Tubes &/Or Ovaries    BENIGN TUMOR REMOVED FROM BACK    PORT-A--CATH PLACEMENT    SHOULDER SURGERY    Egd     No current outpatient prescriptions on file.  Allergies Penicillins and Sulfur  No family history on file.  Social History History  Substance Use Topics  . Smoking status: Not on file  . Smokeless tobacco: Not on file  . Alcohol Use: Not on file  Nonsmoker  Review of Systems Constitutional: No fever/chills Eyes: No visual changes. ENT: No sore throat. Cardiovascular: Denies chest pain. Respiratory: Denies shortness of breath. Gastrointestinal: No abdominal pain.  No nausea, no vomiting.  No diarrhea.  No constipation. Genitourinary: Negative for dysuria. Musculoskeletal: Positive for BLE pain. Negative for back pain. Skin: Negative for rash. Neurological: Negative for headaches, focal weakness or numbness.  10-point ROS otherwise negative.  ____________________________________________   PHYSICAL EXAM:  VITAL SIGNS: ED Triage Vitals  Enc Vitals Group     BP 02/27/15 0209 140/80 mmHg     Pulse Rate 02/27/15 0209 79     Resp 02/27/15 0209 20     Temp 02/27/15 0209 98.3 F (36.8 C)     Temp Source 02/27/15 0209 Oral     SpO2 02/27/15 0209 93 %     Weight 02/27/15 0209 166 lb (75.297 kg)     Height 02/27/15 0209 5\' 4"  (1.626 m)     Head Cir --      Peak Flow --      Pain Score 02/27/15 0206 10     Pain Loc --  Pain Edu? --      Excl. in GC? --     Constitutional: Alert and oriented. Well appearing and in no acute distress. Eyes: Conjunctivae are normal. PERRL. EOMI. Head: Atraumatic. Nose: No congestion/rhinnorhea. Mouth/Throat: Mucous membranes are moist.  Oropharynx non-erythematous. Neck: No stridor. No cervical spine tenderness to palpation. Cardiovascular: Normal rate, regular rhythm. Grossly normal heart sounds.  Good peripheral circulation. Respiratory: Normal respiratory effort.  No retractions. Lungs  CTAB. Gastrointestinal: Soft and nontender. No distention. No abdominal bruits. No CVA tenderness. Musculoskeletal: Fingers of both hands has the characteristic appearance of rheumatoid arthritis. Stable pelvis. No hip pain on palpation laterally. There is a small hematoma to anterior right tibia. Full range of motion of right lower leg without pain. There is a small abrasion to anterior left knee. Patient has 4 range of motion of left knee with mild pain.  Neurologic:  Normal speech and language. No gross focal neurologic deficits are appreciated. Speech is normal. No gait instability. Skin:  Skin is warm, dry and intact. No rash noted. Psychiatric: Mood and affect are normal. Speech and behavior are normal.  ____________________________________________   LABS (all labs ordered are listed, but only abnormal results are displayed)  Labs Reviewed - No data to display ____________________________________________  EKG  None ____________________________________________  RADIOLOGY  Left knee x-rays (viewed by me, interpreted per Dr. Karie Kirks): Focal pretibial soft tissue swelling without acute osseous process.  Right tibia/fibula (viewed by me, interpreted per Dr. Karie Kirks):  Focal pretibial soft tissue swelling without acute osseous process. ____________________________________________   PROCEDURES  Procedure(s) performed: None  Critical Care performed: No  ____________________________________________   INITIAL IMPRESSION / ASSESSMENT AND PLAN / ED COURSE  Pertinent labs & imaging results that were available during my care of the patient were reviewed by me and considered in my medical decision making (see chart for details).  69 year old female who presents s/p mechanical fall with complaints of right shin and left knee pain. Plan for imaging studies and analgesia.  ----------------------------------------- 6:26 AM on  02/27/2015 -----------------------------------------  Updated patient of negative x-ray findings. Plan for limited prescription of analgesics, follow-up with orthopedics. Patient has a walker at home; I have encouraged her to use her walker for the next several days for balance. Strict return precautions given. Patient verbalizes understanding and agrees with plan of care. ____________________________________________   FINAL CLINICAL IMPRESSION(S) / ED DIAGNOSES  Final diagnoses:  Fall, initial encounter  Traumatic hematoma of lower leg, right, initial encounter  Knee abrasion, left, initial encounter      Irean Hong, MD 02/27/15 [phone]

## 2015-03-27 DEATH — deceased

## 2015-07-22 IMAGING — CR DG CHEST 1V PORT
1 series · 1 of 1 positions shown · non-contrast
Comparison: May 19, 2013.

CLINICAL DATA: Dyspnea and coughing.

EXAM:
PORTABLE CHEST - 1 VIEW

[ap]
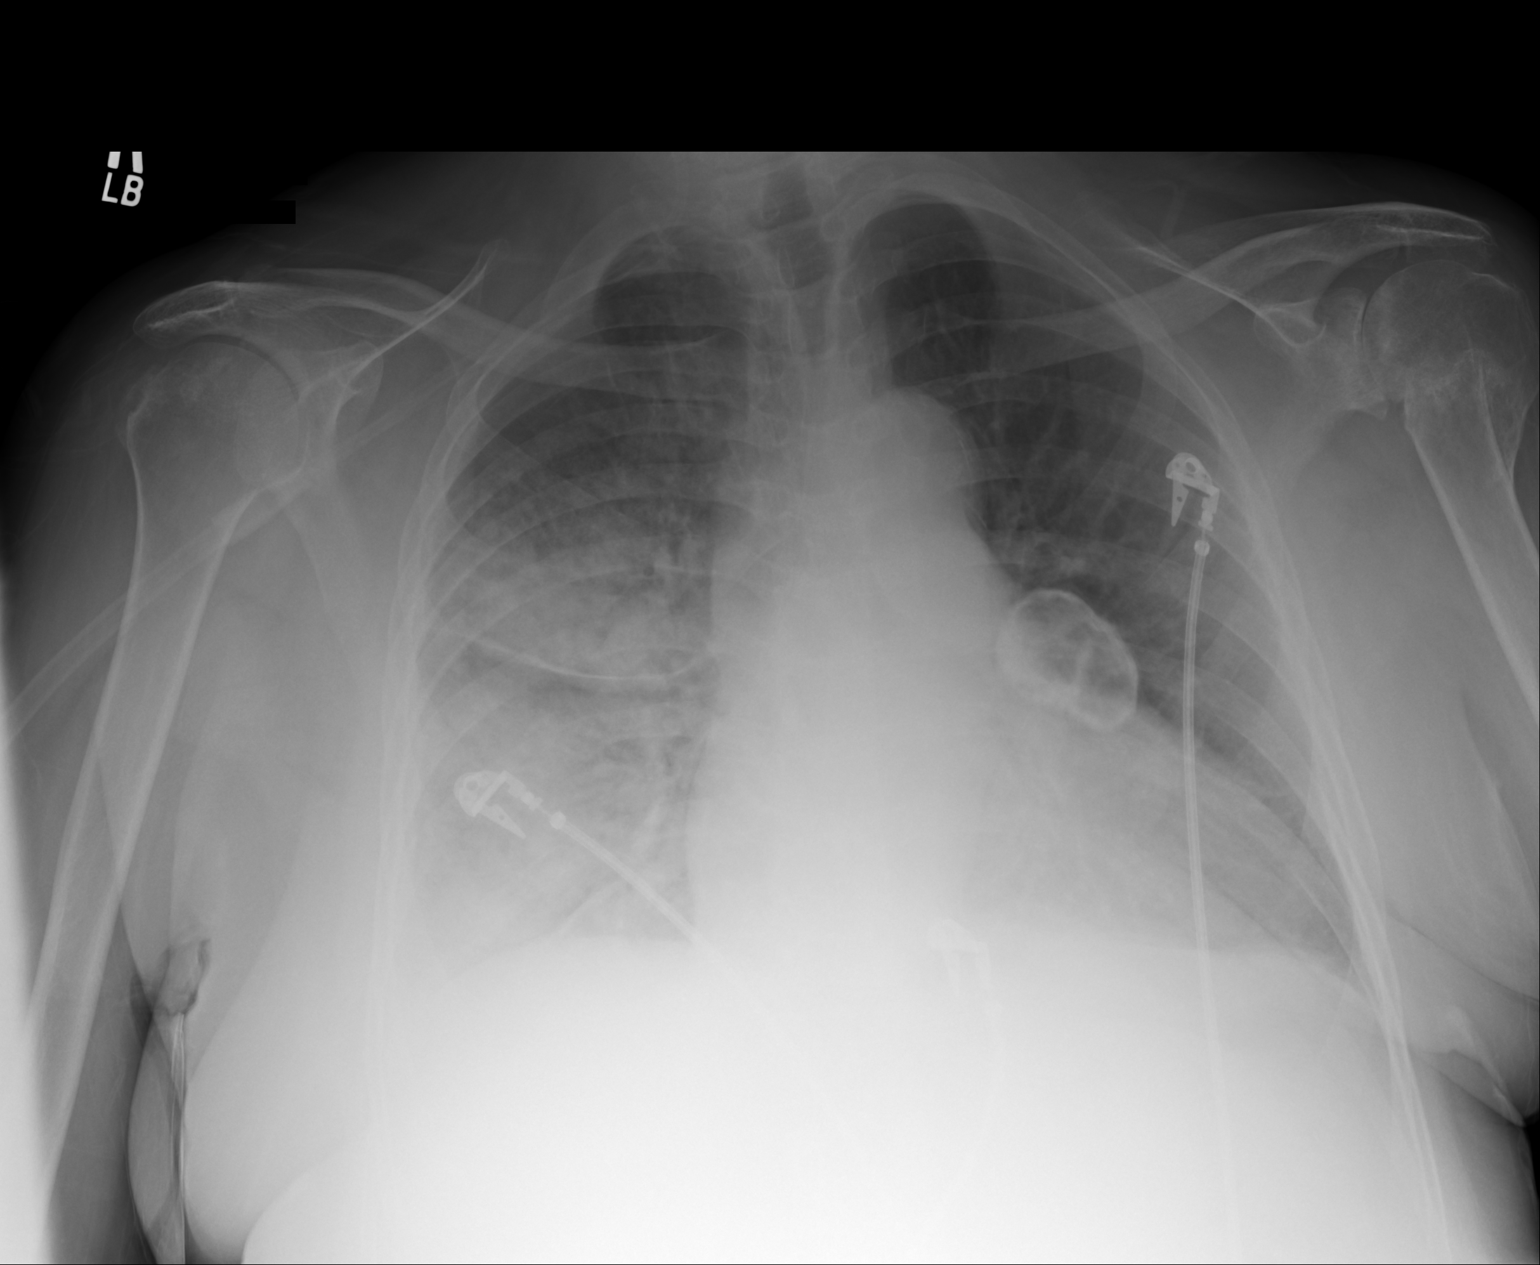

[1 of 1 positions shown; findings below may reference images not displayed]

FINDINGS: Since the previous study there developed confluent alveolar
densities. In the right mid and lower lung. The right hemidiaphragm
is partially obscured laterally. The right heart border remains
sharp. The cardiac silhouette remains mildly enlarged. The pulmonary
vascularity is minimally prominent. Stable calcification lateral to
the right hilum is present. The observed portions of the bony thorax
reveal chronic stable changes of the humeral head and neck on the
left.
IMPRESSION: 1. The findings are consistent with pneumonia in the right lower
lobe and possibly portions of the inferior aspect of the right upper
lobe.
2. Low grade CHF is suspected.
